# Patient Record
Sex: Male | Born: 1935 | Race: White | Hispanic: No | Marital: Married | Smoking: Former smoker
Health system: Southern US, Community
[De-identification: ages and names within clinical notes are randomized; demographics above are authoritative.]

## PROBLEM LIST (undated history)

## (undated) DIAGNOSIS — J449 Chronic obstructive pulmonary disease, unspecified: Secondary | ICD-10-CM

## (undated) DIAGNOSIS — I4891 Unspecified atrial fibrillation: Secondary | ICD-10-CM

## (undated) DIAGNOSIS — L570 Actinic keratosis: Secondary | ICD-10-CM

## (undated) DIAGNOSIS — I729 Aneurysm of unspecified site: Secondary | ICD-10-CM

## (undated) DIAGNOSIS — I10 Essential (primary) hypertension: Secondary | ICD-10-CM

## (undated) DIAGNOSIS — M1990 Unspecified osteoarthritis, unspecified site: Secondary | ICD-10-CM

## (undated) DIAGNOSIS — H409 Unspecified glaucoma: Secondary | ICD-10-CM

## (undated) HISTORY — DX: Unspecified osteoarthritis, unspecified site: M19.90

## (undated) HISTORY — PX: LEG SURGERY: SHX1003

## (undated) HISTORY — DX: Essential (primary) hypertension: I10

## (undated) HISTORY — DX: Actinic keratosis: L57.0

## (undated) HISTORY — DX: Unspecified glaucoma: H40.9

## (undated) HISTORY — DX: Unspecified atrial fibrillation: I48.91

---
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
## 2005-05-11 ENCOUNTER — Ambulatory Visit: Payer: Self-pay | Admitting: Internal Medicine

## 2007-02-21 ENCOUNTER — Ambulatory Visit: Payer: Self-pay | Admitting: Ophthalmology

## 2007-02-21 ENCOUNTER — Other Ambulatory Visit: Payer: Self-pay

## 2007-02-28 ENCOUNTER — Ambulatory Visit: Payer: Self-pay | Admitting: Ophthalmology

## 2008-07-13 HISTORY — PX: COLONOSCOPY: SHX174

## 2009-05-01 ENCOUNTER — Ambulatory Visit: Payer: Self-pay | Admitting: Family Medicine

## 2009-07-13 HISTORY — PX: SPINAL FUSION: SHX223

## 2009-12-03 ENCOUNTER — Ambulatory Visit (HOSPITAL_COMMUNITY): Admission: RE | Admit: 2009-12-03 | Discharge: 2009-12-04 | Payer: Self-pay | Admitting: Neurosurgery

## 2009-12-05 ENCOUNTER — Emergency Department: Payer: Self-pay | Admitting: Emergency Medicine

## 2010-03-13 ENCOUNTER — Ambulatory Visit: Payer: Self-pay | Admitting: Unknown Physician Specialty

## 2010-04-29 ENCOUNTER — Inpatient Hospital Stay (HOSPITAL_COMMUNITY): Admission: RE | Admit: 2010-04-29 | Discharge: 2010-05-01 | Payer: Self-pay | Admitting: Neurosurgery

## 2010-05-29 ENCOUNTER — Encounter: Admission: RE | Admit: 2010-05-29 | Discharge: 2010-05-29 | Payer: Self-pay | Admitting: Neurosurgery

## 2010-07-13 HISTORY — PX: CATARACT EXTRACTION: SUR2

## 2010-08-13 ENCOUNTER — Other Ambulatory Visit: Payer: Self-pay | Admitting: Neurosurgery

## 2010-08-13 ENCOUNTER — Ambulatory Visit
Admission: RE | Admit: 2010-08-13 | Discharge: 2010-08-13 | Disposition: A | Discharge status: Home / Self Care | Payer: Medicare Other | Source: Ambulatory Visit | Attending: Neurosurgery | Admitting: Neurosurgery

## 2010-08-13 DIAGNOSIS — Z981 Arthrodesis status: Secondary | ICD-10-CM

## 2010-09-25 LAB — DIFFERENTIAL
Basophils Absolute: 0 10*3/uL (ref 0.0–0.1)
Basophils Relative: 1 % (ref 0–1)
Eosinophils Absolute: 0.1 10*3/uL (ref 0.0–0.7)
Eosinophils Relative: 1 % (ref 0–5)
Lymphocytes Relative: 21 % (ref 12–46)
Lymphs Abs: 1.1 10*3/uL (ref 0.7–4.0)
Monocytes Absolute: 0.6 10*3/uL (ref 0.1–1.0)
Monocytes Relative: 12 % (ref 3–12)
Neutro Abs: 3.4 10*3/uL (ref 1.7–7.7)
Neutrophils Relative %: 66 % (ref 43–77)

## 2010-09-25 LAB — BASIC METABOLIC PANEL
BUN: 15 mg/dL (ref 6–23)
CO2: 28 mEq/L (ref 19–32)
Calcium: 9.9 mg/dL (ref 8.4–10.5)
Chloride: 108 mEq/L (ref 96–112)
Creatinine, Ser: 0.94 mg/dL (ref 0.4–1.5)
GFR calc Af Amer: >60 mL/min (ref >60)
GFR calc non Af Amer: >60 mL/min (ref >60)
Glucose, Bld: 96 mg/dL (ref 70–99)
Potassium: 4.8 mEq/L (ref 3.5–5.1)
Sodium: 143 mEq/L (ref 135–145)

## 2010-09-25 LAB — SURGICAL PCR SCREEN
MRSA, PCR: NEGATIVE
Staphylococcus aureus: NEGATIVE

## 2010-09-25 LAB — CBC
HCT: 46.5 % (ref 39.0–52.0)
Hemoglobin: 15.5 g/dL (ref 13.0–17.0)
MCH: 29.2 pg (ref 26.0–34.0)
MCHC: 33.3 g/dL (ref 30.0–36.0)
MCV: 87.7 fL (ref 78.0–100.0)
Platelets: 161 10*3/uL (ref 150–400)
RBC: 5.3 MIL/uL (ref 4.22–5.81)
RDW: 14.4 % (ref 11.5–15.5)
WBC: 5.2 10*3/uL (ref 4.0–10.5)

## 2010-09-25 LAB — TYPE AND SCREEN
ABO/RH(D): O POS
Antibody Screen: NEGATIVE

## 2010-09-29 LAB — BASIC METABOLIC PANEL
BUN: 18 mg/dL (ref 6–23)
CO2: 29 mEq/L (ref 19–32)
Calcium: 9.6 mg/dL (ref 8.4–10.5)
Chloride: 105 mEq/L (ref 96–112)
Creatinine, Ser: 0.92 mg/dL (ref 0.4–1.5)
GFR calc Af Amer: >60 mL/min (ref >60)
GFR calc non Af Amer: >60 mL/min (ref >60)
Glucose, Bld: 93 mg/dL (ref 70–99)
Potassium: 4.8 mEq/L (ref 3.5–5.1)
Sodium: 139 mEq/L (ref 135–145)

## 2010-09-29 LAB — DIFFERENTIAL
Basophils Absolute: 0 10*3/uL (ref 0.0–0.1)
Basophils Relative: 0 % (ref 0–1)
Eosinophils Absolute: 0 10*3/uL (ref 0.0–0.7)
Eosinophils Relative: 1 % (ref 0–5)
Lymphocytes Relative: 13 % (ref 12–46)
Lymphs Abs: 0.8 10*3/uL (ref 0.7–4.0)
Monocytes Absolute: 0.9 10*3/uL (ref 0.1–1.0)
Monocytes Relative: 14 % — ABNORMAL HIGH (ref 3–12)
Neutro Abs: 4.6 10*3/uL (ref 1.7–7.7)
Neutrophils Relative %: 72 % (ref 43–77)

## 2010-09-29 LAB — CBC
HCT: 44.8 % (ref 39.0–52.0)
Hemoglobin: 15.1 g/dL (ref 13.0–17.0)
MCHC: 33.8 g/dL (ref 30.0–36.0)
MCV: 89.6 fL (ref 78.0–100.0)
Platelets: 158 10*3/uL (ref 150–400)
RBC: 5.01 MIL/uL (ref 4.22–5.81)
RDW: 14.7 % (ref 11.5–15.5)
WBC: 6.4 10*3/uL (ref 4.0–10.5)

## 2010-09-29 LAB — SURGICAL PCR SCREEN
MRSA, PCR: NEGATIVE
Staphylococcus aureus: POSITIVE — AB

## 2010-09-29 LAB — TYPE AND SCREEN
ABO/RH(D): O POS
Antibody Screen: NEGATIVE

## 2010-09-29 LAB — ABO/RH: ABO/RH(D): O POS

## 2011-01-21 ENCOUNTER — Ambulatory Visit: Payer: Self-pay | Admitting: Ophthalmology

## 2011-03-11 ENCOUNTER — Ambulatory Visit: Payer: Self-pay | Admitting: Internal Medicine

## 2011-05-15 IMAGING — CR DG LUMBAR SPINE 2-3V
2 series · 2 of 2 positions shown · non-contrast
Comparison: 05/29/2010.

CLINICAL DATA: Post laminectomy.  No complaints.

LUMBAR SPINE - 2-3 VIEW

[t l-spine a.p.]
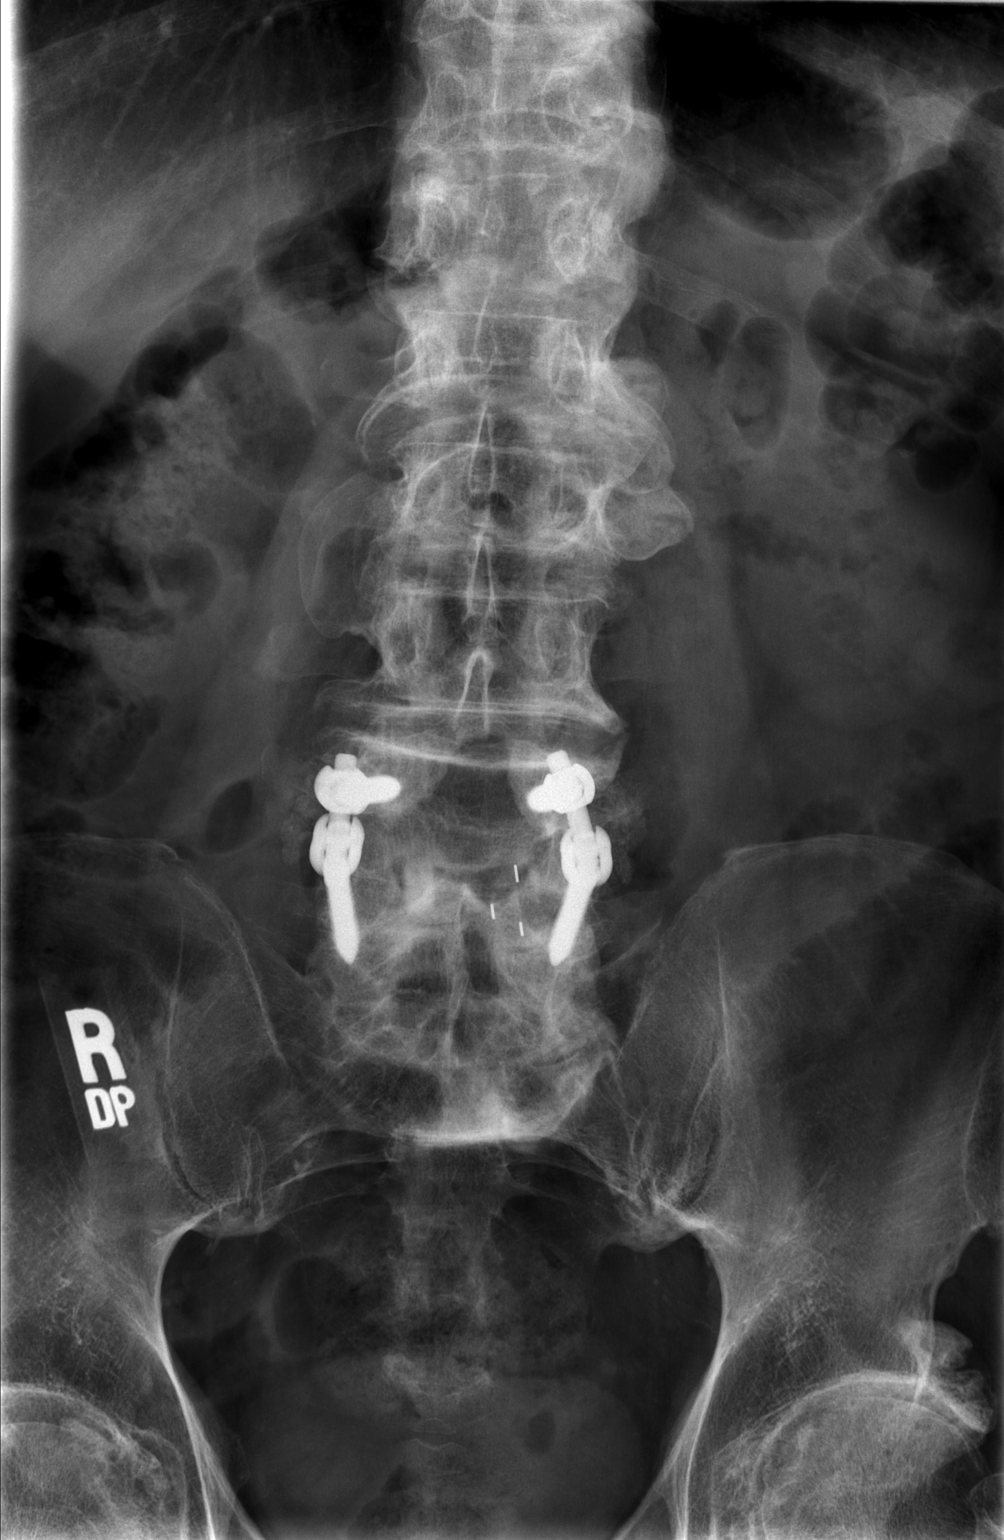

[t l-spine lat]
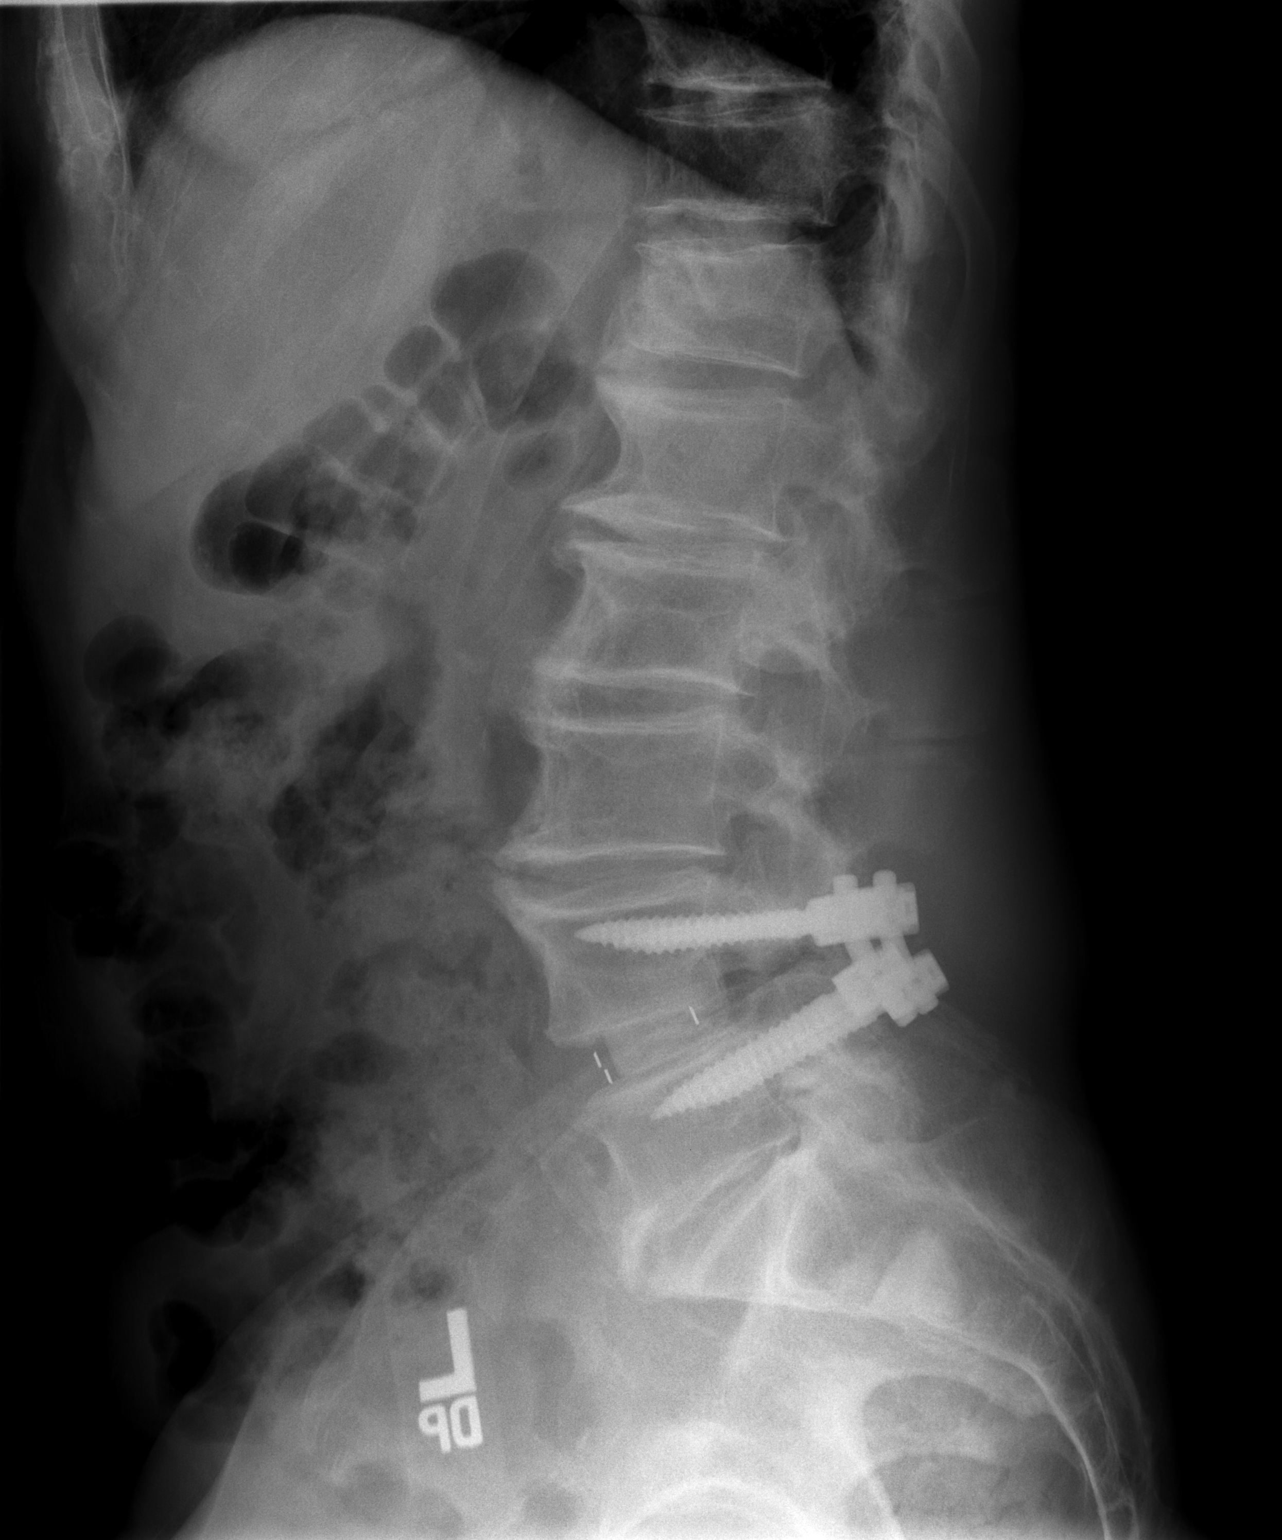

[2 of 2 positions shown; findings below may reference images not displayed]

FINDINGS: Fusion L4-5 with interbody spacer and pedicle screws.
Bony incorporation across disc space cannot be confirmed on plain
film exam.

Degenerative changes lower thoracic and remainder of lumbar spine
unchanged.

Sclerotic appearance of the S2 vertebra unchanged.  Etiology
indeterminate.  If the patient had a history of prostate cancer,
metastatic disease would then need to be considered.

Bilateral hip joint degenerative changes greater on the left.
IMPRESSION: Fusion L4-5 with interbody spacer and pedicle screws.  Bony
incorporation across disc space cannot be confirmed on plain film
exam.

Degenerative changes lower thoracic and remainder of lumbar spine
unchanged.

Sclerotic appearance of the S2 vertebra unchanged.  Etiology
indeterminate.  If the patient had a history of prostate cancer,
metastatic disease would then need to be considered.

Bilateral hip joint degenerative changes greater on the left.

## 2011-06-09 ENCOUNTER — Ambulatory Visit: Payer: Self-pay | Admitting: Gastroenterology

## 2011-07-14 DIAGNOSIS — I4891 Unspecified atrial fibrillation: Secondary | ICD-10-CM

## 2011-07-14 HISTORY — DX: Unspecified atrial fibrillation: I48.91

## 2012-08-20 ENCOUNTER — Encounter: Payer: Self-pay | Admitting: *Deleted

## 2012-08-20 DIAGNOSIS — I10 Essential (primary) hypertension: Secondary | ICD-10-CM | POA: Insufficient documentation

## 2013-04-06 ENCOUNTER — Ambulatory Visit: Payer: Self-pay | Admitting: Vascular Surgery

## 2013-09-12 ENCOUNTER — Encounter: Payer: Self-pay | Admitting: General Surgery

## 2013-09-12 ENCOUNTER — Ambulatory Visit (INDEPENDENT_AMBULATORY_CARE_PROVIDER_SITE_OTHER): Payer: Medicare Other | Admitting: General Surgery

## 2013-09-12 VITALS — BP 144/62 | HR 76 | Resp 16 | Ht 71.0 in | Wt 185.0 lb

## 2013-09-12 DIAGNOSIS — I6529 Occlusion and stenosis of unspecified carotid artery: Secondary | ICD-10-CM

## 2013-09-12 NOTE — Progress Notes (Signed)
This is a 79 year old male here today for an carotid ultrasound. No new problems at this time.  Duplex study completed showing no changes in the bilateral mild plaquing

## 2013-09-12 NOTE — Patient Instructions (Signed)
Patient to return in 1 year for follow up visit. Patient to call with any questions or concerns.

## 2013-09-14 ENCOUNTER — Encounter: Payer: Self-pay | Admitting: General Surgery

## 2013-09-22 ENCOUNTER — Other Ambulatory Visit: Payer: Medicare Other

## 2013-09-22 ENCOUNTER — Other Ambulatory Visit: Payer: Self-pay | Admitting: *Deleted

## 2013-09-22 DIAGNOSIS — I6529 Occlusion and stenosis of unspecified carotid artery: Secondary | ICD-10-CM

## 2014-08-13 DIAGNOSIS — I714 Abdominal aortic aneurysm, without rupture, unspecified: Secondary | ICD-10-CM | POA: Insufficient documentation

## 2014-08-13 DIAGNOSIS — I6523 Occlusion and stenosis of bilateral carotid arteries: Secondary | ICD-10-CM | POA: Insufficient documentation

## 2014-09-19 ENCOUNTER — Encounter: Payer: Self-pay | Admitting: General Surgery

## 2014-09-19 ENCOUNTER — Ambulatory Visit: Payer: Medicare Other

## 2014-09-19 ENCOUNTER — Ambulatory Visit (INDEPENDENT_AMBULATORY_CARE_PROVIDER_SITE_OTHER): Payer: Medicare Other | Admitting: General Surgery

## 2014-09-19 VITALS — BP 150/80 | HR 78 | Resp 14 | Ht 71.0 in | Wt 187.0 lb

## 2014-09-19 DIAGNOSIS — I6529 Occlusion and stenosis of unspecified carotid artery: Secondary | ICD-10-CM

## 2014-09-19 NOTE — Progress Notes (Signed)
Here today for carotid ultrasound. Denies any problems. Mild right ankle swelling he describes as chronic. Denies any dizziness or light headedness.   Duplex study completed. Jeremy Higgins is no change in the focal bilateral plquing causing less than 25% stenosis Follow up in one year.

## 2014-09-19 NOTE — Patient Instructions (Addendum)
The patient is aware to call back for any questions or concerns. Follow up in one year. 

## 2015-05-28 ENCOUNTER — Other Ambulatory Visit: Payer: Self-pay | Admitting: Vascular Surgery

## 2015-05-28 DIAGNOSIS — I714 Abdominal aortic aneurysm, without rupture, unspecified: Secondary | ICD-10-CM

## 2015-06-03 ENCOUNTER — Ambulatory Visit
Admission: RE | Admit: 2015-06-03 | Discharge: 2015-06-03 | Disposition: A | Discharge status: Home / Self Care | Payer: Medicare Other | Source: Ambulatory Visit | Attending: Vascular Surgery | Admitting: Vascular Surgery

## 2015-06-03 DIAGNOSIS — K5730 Diverticulosis of large intestine without perforation or abscess without bleeding: Secondary | ICD-10-CM | POA: Diagnosis not present

## 2015-06-03 DIAGNOSIS — I714 Abdominal aortic aneurysm, without rupture, unspecified: Secondary | ICD-10-CM

## 2015-06-03 LAB — POCT I-STAT CREATININE: CREATININE: 1.1 mg/dL (ref 0.61–1.24)

## 2015-06-03 MED ORDER — IOHEXOL 350 MG/ML SOLN
125.0000 mL | Freq: Once | INTRAVENOUS | Status: AC | PRN
  Administered 2015-06-03: 100 mL via INTRAVENOUS

## 2015-06-03 MED ORDER — IOHEXOL 350 MG/ML SOLN: 100.0000 mL | Freq: Once | INTRAVENOUS | Status: DC | PRN

## 2015-06-11 ENCOUNTER — Other Ambulatory Visit: Payer: Self-pay | Admitting: Vascular Surgery

## 2015-06-18 ENCOUNTER — Encounter: Payer: Self-pay | Admitting: *Deleted

## 2015-06-18 ENCOUNTER — Encounter
Admission: RE | Admit: 2015-06-18 | Discharge: 2015-06-18 | Disposition: A | Discharge status: Home / Self Care | Payer: Medicare Other | Source: Ambulatory Visit | Attending: Vascular Surgery | Admitting: Vascular Surgery

## 2015-06-18 ENCOUNTER — Ambulatory Visit: Payer: Medicare Other

## 2015-06-18 ENCOUNTER — Ambulatory Visit
Admission: RE | Admit: 2015-06-18 | Discharge: 2015-06-18 | Disposition: A | Discharge status: Home / Self Care | Payer: Medicare Other | Source: Ambulatory Visit | Attending: Vascular Surgery | Admitting: Vascular Surgery

## 2015-06-18 ENCOUNTER — Encounter
Admission: RE | Disposition: A | Discharge status: Home / Self Care | Payer: Self-pay | Source: Ambulatory Visit | Attending: Vascular Surgery

## 2015-06-18 DIAGNOSIS — J449 Chronic obstructive pulmonary disease, unspecified: Secondary | ICD-10-CM | POA: Diagnosis not present

## 2015-06-18 DIAGNOSIS — I723 Aneurysm of iliac artery: Secondary | ICD-10-CM | POA: Insufficient documentation

## 2015-06-18 DIAGNOSIS — Z7901 Long term (current) use of anticoagulants: Secondary | ICD-10-CM | POA: Diagnosis not present

## 2015-06-18 DIAGNOSIS — K210 Gastro-esophageal reflux disease with esophagitis: Secondary | ICD-10-CM | POA: Diagnosis not present

## 2015-06-18 DIAGNOSIS — H409 Unspecified glaucoma: Secondary | ICD-10-CM | POA: Insufficient documentation

## 2015-06-18 DIAGNOSIS — E785 Hyperlipidemia, unspecified: Secondary | ICD-10-CM | POA: Insufficient documentation

## 2015-06-18 DIAGNOSIS — G14 Postpolio syndrome: Secondary | ICD-10-CM | POA: Insufficient documentation

## 2015-06-18 DIAGNOSIS — I2510 Atherosclerotic heart disease of native coronary artery without angina pectoris: Secondary | ICD-10-CM | POA: Insufficient documentation

## 2015-06-18 DIAGNOSIS — R739 Hyperglycemia, unspecified: Secondary | ICD-10-CM | POA: Insufficient documentation

## 2015-06-18 DIAGNOSIS — M109 Gout, unspecified: Secondary | ICD-10-CM | POA: Diagnosis not present

## 2015-06-18 DIAGNOSIS — Z87891 Personal history of nicotine dependence: Secondary | ICD-10-CM | POA: Diagnosis not present

## 2015-06-18 DIAGNOSIS — I10 Essential (primary) hypertension: Secondary | ICD-10-CM | POA: Insufficient documentation

## 2015-06-18 DIAGNOSIS — I714 Abdominal aortic aneurysm, without rupture: Secondary | ICD-10-CM | POA: Diagnosis present

## 2015-06-18 DIAGNOSIS — Z79899 Other long term (current) drug therapy: Secondary | ICD-10-CM | POA: Diagnosis not present

## 2015-06-18 DIAGNOSIS — Z01818 Encounter for other preprocedural examination: Secondary | ICD-10-CM

## 2015-06-18 HISTORY — PX: PERIPHERAL VASCULAR CATHETERIZATION: SHX172C

## 2015-06-18 HISTORY — DX: Aneurysm of unspecified site: I72.9

## 2015-06-18 HISTORY — DX: Chronic obstructive pulmonary disease, unspecified: J44.9

## 2015-06-18 LAB — CBC WITH DIFFERENTIAL/PLATELET
BASOS ABS: 0 10*3/uL (ref 0–0.1)
BASOS PCT: 0 %
EOS ABS: 0.1 10*3/uL (ref 0–0.7)
EOS PCT: 1 %
HCT: 47.6 % (ref 40.0–52.0)
HEMOGLOBIN: 15.4 g/dL (ref 13.0–18.0)
Lymphocytes Relative: 9 %
Lymphs Abs: 0.8 10*3/uL — ABNORMAL LOW (ref 1.0–3.6)
MCH: 28.2 pg (ref 26.0–34.0)
MCHC: 32.4 g/dL (ref 32.0–36.0)
MCV: 87 fL (ref 80.0–100.0)
Monocytes Absolute: 1.3 10*3/uL — ABNORMAL HIGH (ref 0.2–1.0)
Monocytes Relative: 15 %
NEUTROS PCT: 75 %
Neutro Abs: 6.7 10*3/uL — ABNORMAL HIGH (ref 1.4–6.5)
PLATELETS: 147 10*3/uL — AB (ref 150–440)
RBC: 5.47 MIL/uL (ref 4.40–5.90)
RDW: 14.6 % — ABNORMAL HIGH (ref 11.5–14.5)
WBC: 9 10*3/uL (ref 3.8–10.6)

## 2015-06-18 LAB — BASIC METABOLIC PANEL
Anion gap: 7 (ref 5–15)
BUN: 17 mg/dL (ref 6–20)
CALCIUM: 9.2 mg/dL (ref 8.9–10.3)
CO2: 25 mmol/L (ref 22–32)
CREATININE: 1 mg/dL (ref 0.61–1.24)
Chloride: 108 mmol/L (ref 101–111)
Glucose, Bld: 100 mg/dL — ABNORMAL HIGH (ref 65–99)
Potassium: 4.2 mmol/L (ref 3.5–5.1)
SODIUM: 140 mmol/L (ref 135–145)

## 2015-06-18 LAB — PROTIME-INR
INR: 1.08
PROTHROMBIN TIME: 14.2 s (ref 11.4–15.0)

## 2015-06-18 LAB — SURGICAL PCR SCREEN
MRSA, PCR: NEGATIVE
STAPHYLOCOCCUS AUREUS: POSITIVE — AB

## 2015-06-18 LAB — APTT: APTT: 32 s (ref 24–36)

## 2015-06-18 SURGERY — ABDOMINAL AORTOGRAM W/LOWER EXTREMITY
Laterality: Right | Wound class: Clean

## 2015-06-18 MED ORDER — FENTANYL CITRATE (PF) 100 MCG/2ML IJ SOLN
INTRAMUSCULAR | Status: DC | PRN
  Administered 2015-06-18: 50 ug via INTRAVENOUS

## 2015-06-18 MED ORDER — OXYCODONE HCL 5 MG PO TABS: 5.0000 mg | ORAL_TABLET | ORAL | Status: DC | PRN

## 2015-06-18 MED ORDER — DOCUSATE SODIUM 100 MG PO CAPS
100.0000 mg | ORAL_CAPSULE | Freq: Every day | ORAL | Status: DC
  Filled 2015-06-18 (×2): qty 1

## 2015-06-18 MED ORDER — MIDAZOLAM HCL 2 MG/2ML IJ SOLN
INTRAMUSCULAR | Status: DC | PRN
  Administered 2015-06-18: 2 mg via INTRAVENOUS

## 2015-06-18 MED ORDER — METOPROLOL TARTRATE 1 MG/ML IV SOLN: 2.0000 mg | INTRAVENOUS | Status: DC | PRN

## 2015-06-18 MED ORDER — ALUM & MAG HYDROXIDE-SIMETH 200-200-20 MG/5ML PO SUSP: 15.0000 mL | ORAL | Status: DC | PRN

## 2015-06-18 MED ORDER — DEXTROSE 5 % IV SOLN
1.5000 g | INTRAVENOUS | Status: AC
  Administered 2015-06-18: 1.5 g via INTRAVENOUS
  Filled 2015-06-18: qty 1.5

## 2015-06-18 MED ORDER — FENTANYL CITRATE (PF) 100 MCG/2ML IJ SOLN
INTRAMUSCULAR | Status: AC
  Filled 2015-06-18: qty 2

## 2015-06-18 MED ORDER — IOHEXOL 300 MG/ML  SOLN
INTRAMUSCULAR | Status: DC | PRN
  Administered 2015-06-18: 35 mL via INTRA_ARTERIAL

## 2015-06-18 MED ORDER — ONDANSETRON HCL 4 MG/2ML IJ SOLN: 4.0000 mg | Freq: Four times a day (QID) | INTRAMUSCULAR | Status: DC | PRN

## 2015-06-18 MED ORDER — PHENOL 1.4 % MT LIQD
1.0000 | OROMUCOSAL | Status: DC | PRN
  Filled 2015-06-18: qty 177

## 2015-06-18 MED ORDER — HEPARIN SODIUM (PORCINE) 1000 UNIT/ML IJ SOLN
INTRAMUSCULAR | Status: AC
  Filled 2015-06-18: qty 1

## 2015-06-18 MED ORDER — LABETALOL HCL 5 MG/ML IV SOLN: 10.0000 mg | INTRAVENOUS | Status: DC | PRN

## 2015-06-18 MED ORDER — MIDAZOLAM HCL 2 MG/2ML IJ SOLN
INTRAMUSCULAR | Status: AC
  Filled 2015-06-18: qty 2

## 2015-06-18 MED ORDER — SODIUM CHLORIDE 0.9 % IV SOLN: 500.0000 mL | Freq: Once | INTRAVENOUS | Status: DC | PRN

## 2015-06-18 MED ORDER — PANTOPRAZOLE SODIUM 40 MG PO TBEC
40.0000 mg | DELAYED_RELEASE_TABLET | Freq: Every day | ORAL | Status: DC
  Filled 2015-06-18 (×2): qty 1

## 2015-06-18 MED ORDER — ACETAMINOPHEN 325 MG RE SUPP
325.0000 mg | RECTAL | Status: DC | PRN
  Filled 2015-06-18: qty 2

## 2015-06-18 MED ORDER — HYDRALAZINE HCL 20 MG/ML IJ SOLN: 5.0000 mg | INTRAMUSCULAR | Status: DC | PRN

## 2015-06-18 MED ORDER — LIDOCAINE HCL (PF) 1 % IJ SOLN
INTRAMUSCULAR | Status: AC
  Filled 2015-06-18: qty 10

## 2015-06-18 MED ORDER — GUAIFENESIN-DM 100-10 MG/5ML PO SYRP
15.0000 mL | ORAL_SOLUTION | ORAL | Status: DC | PRN
  Filled 2015-06-18: qty 15

## 2015-06-18 MED ORDER — HEPARIN (PORCINE) IN NACL 2-0.9 UNIT/ML-% IJ SOLN
INTRAMUSCULAR | Status: AC
  Filled 2015-06-18: qty 1000

## 2015-06-18 MED ORDER — SODIUM CHLORIDE 0.9 % IV SOLN
INTRAVENOUS | Status: DC
  Administered 2015-06-18 (×2): via INTRAVENOUS

## 2015-06-18 MED ORDER — ACETAMINOPHEN 325 MG PO TABS: 325.0000 mg | ORAL_TABLET | ORAL | Status: DC | PRN

## 2015-06-18 SURGICAL SUPPLY — 14 items
CATH PIG 70CM (CATHETERS) ×5 IMPLANT
CATH RIM 65CM (CATHETERS) ×5 IMPLANT
COIL MREYE 5X8 (Vascular Products) ×5 IMPLANT
COIL NESTER 14X8 (Vascular Products) ×5 IMPLANT
DEVICE CLOSURE MYNXGRIP 5F (Vascular Products) ×5 IMPLANT
GLIDECATH NONTAPER ANGL 5FR (CATHETERS) ×5 IMPLANT
GLIDEWIRE STIFF .35X180X3 HYDR (WIRE) ×5 IMPLANT
PACK ANGIOGRAPHY (CUSTOM PROCEDURE TRAY) ×5 IMPLANT
SET INTRO CAPELLA COAXIAL (SET/KITS/TRAYS/PACK) ×5 IMPLANT
SHEATH BALKIN 5.5FR (SHEATH) ×5 IMPLANT
SHEATH BRITE TIP 5FRX11 (SHEATH) ×10 IMPLANT
SYR MEDRAD MARK V 150ML (SYRINGE) ×5 IMPLANT
TUBING CONTRAST HIGH PRESS 72 (TUBING) ×5 IMPLANT
WIRE J 3MM .035X145CM (WIRE) ×5 IMPLANT

## 2015-06-18 NOTE — Patient Instructions (Signed)
  Your procedure is scheduled on: 06/26/15 Wed Report to Day Surgery. To find out your arrival time please call [phone] between 1PM - 3PM on 06/25/15 Tues.  Remember: Instructions that are not followed completely may result in serious medical risk, up to and including death, or upon the discretion of your surgeon and anesthesiologist your surgery may need to be rescheduled.    _x___ 1. Do not eat food or drink liquids after midnight. No gum chewing or hard candies.     ____ 2. No Alcohol for 24 hours before or after surgery.   ____ 3. Bring all medications with you on the day of surgery if instructed.    __x__ 4. Notify your doctor if there is any change in your medical condition     (cold, fever, infections).     Do not wear jewelry, make-up, hairpins, clips or nail polish.  Do not wear lotions, powders, or perfumes. You may wear deodorant.  Do not shave 48 hours prior to surgery. Men may shave face and neck.  Do not bring valuables to the hospital.    [facility] is not responsible for any belongings or valuables.               Contacts, dentures or bridgework may not be worn into surgery.  Leave your suitcase in the car. After surgery it may be brought to your room.  For patients admitted to the hospital, discharge time is determined by your                treatment team.   Patients discharged the day of surgery will not be allowed to drive home.   Please read over the following fact sheets that you were given:   MRSA Information   ___x_ Take these medicines the morning of surgery with A SIP OF WATER:    1. diltiazem (DILTIAZEM CD) 180 MG 24 hr capsule  2. lisinopril (PRINIVIL,ZESTRIL) 10 MG tablet  3.   4.  5.  6.  ____ Fleet Enema (as directed)   _x___ Use CHG Soap as directed  ____ Use inhalers on the day of surgery  ____ Stop metformin 2 days prior to surgery    ____ Take 1/2 of usual insulin dose the night before surgery and none on the morning of  surgery.   __x__ Stop Coumadin/Plavix/aspirin on stopped ELIQUIS 5 MG TABS tablet 3 days ago for procedure today and stop 06/23/15, 3 days prior to surgery.   _x___ Stop Anti-inflammatories Stopped Aleve 3 days ago   ____ Stop supplements until after surgery.    ____ Bring C-Pap to the hospital.

## 2015-06-18 NOTE — H&P (Signed)
[facility] History & Physical Update  The patient was interviewed and re-examined.  The patient's previous History and Physical has been reviewed and is unchanged.  There is no change in the plan of care. We plan to proceed with the scheduled procedure.  Wallace Gappa, Dolores Lory, MD  06/18/2015, 3:00 PM

## 2015-06-18 NOTE — Pre-Procedure Instructions (Signed)
Dr Nino Parsley office called regarding positive staph culture.  No orders given.

## 2015-06-18 NOTE — Op Note (Signed)
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  Right selective pelvic arteriography second order catheter placement  2.  Coil embolization of the right internal iliac artery in preparation for endograft repair of his abdominal aortic aneurysm  3.  Minx closure left common femoral   Anesthesia: Conscious sedation with IV Versed plus fentanyl  Sheath: 5 [place] Balkan sheath left common femoral  Contrast: 35 cc  Fluoroscopy Time: 4.7 minutes  Indications:  Patient has a large abdominal aortic aneurysm associated with a very large common iliac artery aneurysm on the right. In order to repair this with an endograft will require coil embolization of the right internal to prevent a type II endoleak. The rest benefits of been reviewed with the patient all questions been answered he agrees to proceed with coil embolization.  Procedure:  Jeremy Adler Jonesis a 80 y.o. male who was identified and appropriate procedural time out was performed.  The patient was then placed supine on the table and prepped and draped in the usual sterile fashion.  Ultrasound was used to evaluate the left common femoral artery.  It was patent .  A digital ultrasound image was acquired.  A Seldinger needle was used to access the left common femoral artery under direct ultrasound guidance and a permanent image was performed.  A 0.035 J wire was advanced without resistance and a 5Fr sheath was placed.  A pigtail catheter was advanced into the distal aorta and LAO projection of the distal aorta and iliac arteries was then obtained. Rim catheter and stiff angle Glidewire were then used to cross the bifurcation and subsequently a 5 [place] Balkan sheath was inserted. Using the  guidewire in association with a Kumpe catheter the internal iliac artery was engaged and the wire catheter combination negotiated down into the internal iliac past the first internal iliac artery division area  Hand injection contrast was then used to demonstrate the internal iliac and the 2 branches subsequently the catheter was repositioned slightly and an 8 x 6 nester coil and then an 8 x 14 nester coil was placed within the internal iliac. Follow-up imaging by hand through the Kumpe catheter now in the common iliac artery aneurysm demonstrated the coils were placed so that there is now a flush occlusion of the internal iliac. There is already cessation of flow.  The catheter were was removed and the sheath repositioned into the left external iliac oblique view obtained and a minx device deployed without difficulty.  Findings:   Aortogram:  Aneurysmal changes of the distal aorta as well as the common iliac arteries are noted the right common iliac is very large actually larger than the aortic aneurysm. The catheter is then negotiated into the internal which is imaged by hand demonstrating a short trunk from the origin measuring approximately 1 cm and then a branch medial the main internal continues and then divides again approximately 2 cm below this.  Initially, the 8 x 6 coil was placed and then an 8 x 14 which ends up on final imaging being a flush occlusion of the internal iliac.  Summary: Successful coil embolization of the right internal iliac in preparation for endograft placement     Disposition: Patient was taken to the recovery room in stable condition having tolerated the procedure well.  Saben Donigan,  Dolores Lory 06/18/2015,3:34 PM

## 2015-06-19 ENCOUNTER — Encounter: Payer: Self-pay | Admitting: Vascular Surgery

## 2015-06-19 LAB — ABO/RH: ABO/RH(D): O POS

## 2015-06-19 NOTE — Pre-Procedure Instructions (Signed)
Cleared by Dr Kowalski 

## 2015-06-19 NOTE — Pre-Procedure Instructions (Signed)
EKG sent to [facility] from anesthesia for review.  Copy faxed to Dr. Delana Meyer also.

## 2015-06-26 ENCOUNTER — Ambulatory Visit: Payer: Medicare Other | Admitting: Anesthesiology

## 2015-06-26 ENCOUNTER — Inpatient Hospital Stay
Admission: RE | Admit: 2015-06-26 | Discharge: 2015-06-27 | DRG: 269 | Disposition: A | Discharge status: Home / Self Care | Payer: Medicare Other | Source: Ambulatory Visit | Attending: Vascular Surgery | Admitting: Vascular Surgery

## 2015-06-26 ENCOUNTER — Encounter
Admission: RE | Disposition: A | Discharge status: Home / Self Care | Payer: Self-pay | Source: Ambulatory Visit | Attending: Vascular Surgery

## 2015-06-26 DIAGNOSIS — I714 Abdominal aortic aneurysm, without rupture, unspecified: Secondary | ICD-10-CM | POA: Diagnosis present

## 2015-06-26 DIAGNOSIS — I723 Aneurysm of iliac artery: Secondary | ICD-10-CM | POA: Diagnosis present

## 2015-06-26 HISTORY — PX: PERIPHERAL VASCULAR CATHETERIZATION: SHX172C

## 2015-06-26 LAB — BUN: BUN: 19 mg/dL (ref 6–20)

## 2015-06-26 LAB — PREPARE RBC (CROSSMATCH)

## 2015-06-26 LAB — GLUCOSE, CAPILLARY: Glucose-Capillary: 93 mg/dL (ref 65–99)

## 2015-06-26 LAB — CREATININE, SERUM: Creatinine, Ser: 1.02 mg/dL (ref 0.61–1.24)

## 2015-06-26 SURGERY — ENDOVASCULAR REPAIR/STENT GRAFT
Anesthesia: General

## 2015-06-26 MED ORDER — ALUM & MAG HYDROXIDE-SIMETH 200-200-20 MG/5ML PO SUSP: 15.0000 mL | ORAL | Status: DC | PRN

## 2015-06-26 MED ORDER — DOPAMINE-DEXTROSE 3.2-5 MG/ML-% IV SOLN
3.0000 ug/kg/min | INTRAVENOUS | Status: DC
  Filled 2015-06-26: qty 250

## 2015-06-26 MED ORDER — ACETAMINOPHEN 325 MG PO TABS: 325.0000 mg | ORAL_TABLET | ORAL | Status: DC | PRN

## 2015-06-26 MED ORDER — FENTANYL CITRATE (PF) 100 MCG/2ML IJ SOLN
INTRAMUSCULAR | Status: DC | PRN
  Administered 2015-06-26: 250 ug via INTRAVENOUS

## 2015-06-26 MED ORDER — LIDOCAINE HCL (CARDIAC) 20 MG/ML IV SOLN
INTRAVENOUS | Status: DC | PRN
  Administered 2015-06-26: 100 mg via INTRAVENOUS

## 2015-06-26 MED ORDER — FOLIC ACID 1 MG PO TABS
1.0000 mg | ORAL_TABLET | Freq: Every day | ORAL | Status: DC
  Administered 2015-06-26 – 2015-06-27 (×2): 1 mg via ORAL
  Filled 2015-06-26 (×2): qty 1

## 2015-06-26 MED ORDER — OXYCODONE-ACETAMINOPHEN 5-325 MG PO TABS
1.0000 | ORAL_TABLET | ORAL | Status: DC | PRN
  Administered 2015-06-26: 2 via ORAL
  Filled 2015-06-26: qty 2

## 2015-06-26 MED ORDER — HYDROMORPHONE HCL 1 MG/ML IJ SOLN: 0.2500 mg | INTRAMUSCULAR | Status: DC | PRN

## 2015-06-26 MED ORDER — PHENOL 1.4 % MT LIQD: 1.0000 | OROMUCOSAL | Status: DC | PRN

## 2015-06-26 MED ORDER — LISINOPRIL 10 MG PO TABS
10.0000 mg | ORAL_TABLET | Freq: Every day | ORAL | Status: DC
  Administered 2015-06-26 – 2015-06-27 (×2): 10 mg via ORAL
  Filled 2015-06-26 (×2): qty 1

## 2015-06-26 MED ORDER — MORPHINE SULFATE (PF) 2 MG/ML IV SOLN
2.0000 mg | INTRAVENOUS | Status: DC | PRN
  Administered 2015-06-26: 2 mg via INTRAVENOUS
  Filled 2015-06-26: qty 2

## 2015-06-26 MED ORDER — PHENYLEPHRINE HCL 10 MG/ML IJ SOLN
10.0000 mg | INTRAMUSCULAR | Status: DC | PRN
  Administered 2015-06-26: 30 ug/min via INTRAVENOUS

## 2015-06-26 MED ORDER — POTASSIUM CHLORIDE CRYS ER 20 MEQ PO TBCR: 20.0000 meq | EXTENDED_RELEASE_TABLET | Freq: Every day | ORAL | Status: DC | PRN

## 2015-06-26 MED ORDER — IOHEXOL 300 MG/ML  SOLN
INTRAMUSCULAR | Status: DC | PRN
  Administered 2015-06-26: 55 mL via INTRA_ARTERIAL

## 2015-06-26 MED ORDER — NITROGLYCERIN IN D5W 200-5 MCG/ML-% IV SOLN
5.0000 ug/min | INTRAVENOUS | Status: DC
  Filled 2015-06-26: qty 250

## 2015-06-26 MED ORDER — CEFAZOLIN SODIUM-DEXTROSE 2-3 GM-% IV SOLR
2.0000 g | INTRAVENOUS | Status: AC
  Administered 2015-06-26: 2 g via INTRAVENOUS
  Filled 2015-06-26: qty 50

## 2015-06-26 MED ORDER — LABETALOL HCL 5 MG/ML IV SOLN: 10.0000 mg | INTRAVENOUS | Status: DC | PRN

## 2015-06-26 MED ORDER — APIXABAN 5 MG PO TABS
5.0000 mg | ORAL_TABLET | Freq: Two times a day (BID) | ORAL | Status: DC
  Administered 2015-06-26 – 2015-06-27 (×3): 5 mg via ORAL
  Filled 2015-06-26 (×3): qty 1

## 2015-06-26 MED ORDER — SODIUM CHLORIDE 0.9 % IV SOLN
INTRAVENOUS | Status: DC
  Administered 2015-06-26: 13:00:00 via INTRAVENOUS

## 2015-06-26 MED ORDER — VITAMIN C 500 MG PO TABS
500.0000 mg | ORAL_TABLET | Freq: Every day | ORAL | Status: DC
  Administered 2015-06-26 – 2015-06-27 (×2): 500 mg via ORAL
  Filled 2015-06-26 (×2): qty 1

## 2015-06-26 MED ORDER — PHENYLEPHRINE HCL 10 MG/ML IJ SOLN
INTRAMUSCULAR | Status: DC | PRN
  Administered 2015-06-26: 200 ug via INTRAVENOUS
  Administered 2015-06-26 (×2): 100 ug via INTRAVENOUS

## 2015-06-26 MED ORDER — DILTIAZEM HCL ER COATED BEADS 180 MG PO CP24
180.0000 mg | ORAL_CAPSULE | Freq: Every day | ORAL | Status: DC
  Administered 2015-06-27: 180 mg via ORAL
  Filled 2015-06-26: qty 1

## 2015-06-26 MED ORDER — FAMOTIDINE IN NACL 20-0.9 MG/50ML-% IV SOLN
20.0000 mg | Freq: Two times a day (BID) | INTRAVENOUS | Status: DC
  Administered 2015-06-26 – 2015-06-27 (×3): 20 mg via INTRAVENOUS
  Filled 2015-06-26 (×4): qty 50

## 2015-06-26 MED ORDER — DOCUSATE SODIUM 100 MG PO CAPS
100.0000 mg | ORAL_CAPSULE | Freq: Every day | ORAL | Status: DC
  Administered 2015-06-27: 100 mg via ORAL
  Filled 2015-06-26: qty 1

## 2015-06-26 MED ORDER — HEPARIN SODIUM (PORCINE) 1000 UNIT/ML IJ SOLN
INTRAMUSCULAR | Status: DC | PRN
  Administered 2015-06-26: 5000 [IU] via INTRAVENOUS

## 2015-06-26 MED ORDER — GLYCOPYRROLATE 0.2 MG/ML IJ SOLN
INTRAMUSCULAR | Status: DC | PRN
  Administered 2015-06-26: 0.6 mg via INTRAVENOUS

## 2015-06-26 MED ORDER — FAMOTIDINE 20 MG PO TABS
20.0000 mg | ORAL_TABLET | Freq: Once | ORAL | Status: AC
  Administered 2015-06-26: 20 mg via ORAL

## 2015-06-26 MED ORDER — VANCOMYCIN HCL IN DEXTROSE 1-5 GM/200ML-% IV SOLN
1000.0000 mg | Freq: Two times a day (BID) | INTRAVENOUS | Status: AC
  Administered 2015-06-26 (×2): 1000 mg via INTRAVENOUS
  Filled 2015-06-26 (×2): qty 200

## 2015-06-26 MED ORDER — LACTATED RINGERS IV SOLN
INTRAVENOUS | Status: DC
  Administered 2015-06-26: 07:00:00 via INTRAVENOUS

## 2015-06-26 MED ORDER — ONDANSETRON HCL 4 MG/2ML IJ SOLN
4.0000 mg | Freq: Four times a day (QID) | INTRAMUSCULAR | Status: DC | PRN
  Filled 2015-06-26: qty 2

## 2015-06-26 MED ORDER — DEXTROSE 5 % IV SOLN
1.5000 g | Freq: Two times a day (BID) | INTRAVENOUS | Status: AC
  Administered 2015-06-26 (×2): 1.5 g via INTRAVENOUS
  Filled 2015-06-26 (×2): qty 1.5

## 2015-06-26 MED ORDER — LATANOPROST 0.005 % OP SOLN
1.0000 [drp] | Freq: Every day | OPHTHALMIC | Status: DC
  Administered 2015-06-26: 1 [drp] via OPHTHALMIC
  Filled 2015-06-26: qty 2.5

## 2015-06-26 MED ORDER — FAMOTIDINE 20 MG PO TABS
ORAL_TABLET | ORAL | Status: AC
  Administered 2015-06-26: 20 mg via ORAL
  Filled 2015-06-26: qty 1

## 2015-06-26 MED ORDER — SODIUM CHLORIDE 0.9 % IV SOLN: 500.0000 mL | Freq: Once | INTRAVENOUS | Status: DC | PRN

## 2015-06-26 MED ORDER — PRAVASTATIN SODIUM 20 MG PO TABS
40.0000 mg | ORAL_TABLET | Freq: Every day | ORAL | Status: DC
  Administered 2015-06-26: 40 mg via ORAL
  Filled 2015-06-26: qty 2
  Filled 2015-06-26: qty 1

## 2015-06-26 MED ORDER — ONDANSETRON HCL 4 MG/2ML IJ SOLN: 4.0000 mg | Freq: Once | INTRAMUSCULAR | Status: DC | PRN

## 2015-06-26 MED ORDER — HYDRALAZINE HCL 20 MG/ML IJ SOLN: 5.0000 mg | INTRAMUSCULAR | Status: DC | PRN

## 2015-06-26 MED ORDER — ACETAMINOPHEN 325 MG RE SUPP: 325.0000 mg | RECTAL | Status: DC | PRN

## 2015-06-26 MED ORDER — METOPROLOL TARTRATE 1 MG/ML IV SOLN
2.0000 mg | INTRAVENOUS | Status: DC | PRN
  Administered 2015-06-27: 5 mg via INTRAVENOUS
  Filled 2015-06-26: qty 5

## 2015-06-26 MED ORDER — ROCURONIUM BROMIDE 100 MG/10ML IV SOLN
INTRAVENOUS | Status: DC | PRN
  Administered 2015-06-26: 50 mg via INTRAVENOUS
  Administered 2015-06-26: 15 mg via INTRAVENOUS

## 2015-06-26 MED ORDER — MAGNESIUM SULFATE 2 GM/50ML IV SOLN: 2.0000 g | Freq: Every day | INTRAVENOUS | Status: DC | PRN

## 2015-06-26 MED ORDER — HYDROMORPHONE HCL 1 MG/ML IJ SOLN
INTRAMUSCULAR | Status: DC | PRN
  Administered 2015-06-26: 0.5 mg via INTRAVENOUS

## 2015-06-26 MED ORDER — GUAIFENESIN-DM 100-10 MG/5ML PO SYRP: 15.0000 mL | ORAL_SOLUTION | ORAL | Status: DC | PRN

## 2015-06-26 MED ORDER — NEOSTIGMINE METHYLSULFATE 10 MG/10ML IV SOLN
INTRAVENOUS | Status: DC | PRN
  Administered 2015-06-26: 4 mg via INTRAVENOUS

## 2015-06-26 MED ORDER — PROPOFOL 10 MG/ML IV BOLUS
INTRAVENOUS | Status: DC | PRN
  Administered 2015-06-26: 150 mg via INTRAVENOUS

## 2015-06-26 SURGICAL SUPPLY — 41 items
BALLN ULTRVRSE 12X40X75 (BALLOONS) ×6
BALLOON ULTRVRSE 12X40X75 (BALLOONS) ×2 IMPLANT
BRUSH SCRUB 4% CHG (MISCELLANEOUS) ×3 IMPLANT
CATH ACCU-VU SIZ PIG 5F 70CM (CATHETERS) ×3 IMPLANT
CATH BALLN CODA 9X100X32 (BALLOONS) ×3 IMPLANT
CATH KA2 5FR 65CM (CATHETERS) ×3 IMPLANT
DEVICE PRESTO INFLATION (MISCELLANEOUS) ×3 IMPLANT
DEVICE TORQUE (MISCELLANEOUS) ×3 IMPLANT
DRYSEAL FLEXSHEATH 12FR 33CM (SHEATH) ×2
DRYSEAL FLEXSHEATH 16FR 33CM (SHEATH) ×2
EXCLUDER TNK LEG 26MX12X18 (Endovascular Graft) ×1 IMPLANT
EXCLUDER TRUNK LEG 26MX12X18 (Endovascular Graft) ×3 IMPLANT
GAUZE SPONGE 4X4 12PLY STRL (GAUZE/BANDAGES/DRESSINGS) ×6 IMPLANT
GLIDEWIRE STIFF .35X180X3 HYDR (WIRE) ×3 IMPLANT
GLOVE BIO SURGEON STRL SZ7 (GLOVE) ×18 IMPLANT
GLOVE SURG SYN 8.0 (GLOVE) ×3 IMPLANT
GOWN STRL REUS W/ TWL LRG LVL3 (GOWN DISPOSABLE) ×2 IMPLANT
GOWN STRL REUS W/ TWL XL LVL3 (GOWN DISPOSABLE) ×2 IMPLANT
GOWN STRL REUS W/TWL LRG LVL3 (GOWN DISPOSABLE) ×4
GOWN STRL REUS W/TWL XL LVL3 (GOWN DISPOSABLE) ×4
GRAFT EXCLUDER LEG 14.5X14 (Endovascular Graft) ×3 IMPLANT
GRAFT EXCLUDER LEG 16X10 (Endovascular Graft) ×3 IMPLANT
LIQUID BAND (GAUZE/BANDAGES/DRESSINGS) ×3 IMPLANT
PACK BASIN MAJOR ARMC (MISCELLANEOUS) ×3 IMPLANT
PAD GROUND ADULT SPLIT (MISCELLANEOUS) ×3 IMPLANT
PREP CHG 10.5 TEAL (MISCELLANEOUS) ×3 IMPLANT
SET INTRO CAPELLA COAXIAL (SET/KITS/TRAYS/PACK) ×3 IMPLANT
SHEATH BRITE TIP 6FRX11 (SHEATH) ×6 IMPLANT
SHEATH BRITE TIP 8FRX11 (SHEATH) ×6 IMPLANT
SHEATH DRYSEAL FLEX 12FR 33CM (SHEATH) ×1 IMPLANT
SHEATH DRYSEAL FLEX 16FR 33CM (SHEATH) ×1 IMPLANT
SUT MNCRL 4-0 (SUTURE) ×2
SUT MNCRL 4-0 27XMFL (SUTURE) ×1
SUTURE MNCRL 4-0 27XMF (SUTURE) ×1 IMPLANT
SYR 20CC LL (SYRINGE) ×3 IMPLANT
SYR MEDRAD MARK V 150ML (SYRINGE) ×3 IMPLANT
TOWEL OR 17X26 4PK STRL BLUE (TOWEL DISPOSABLE) ×6 IMPLANT
TUBING CONTRAST HIGH PRESS 72 (TUBING) ×3 IMPLANT
WIRE AMPLATZ SSTIFF .035X260CM (WIRE) ×6 IMPLANT
WIRE J 3MM .035X145CM (WIRE) ×6 IMPLANT
WIRE NITINOL .018 (WIRE) ×3 IMPLANT

## 2015-06-26 NOTE — Op Note (Signed)
OPERATIVE NOTE   PROCEDURE: 1. US guidance for vascular access, bilateral femoral arteries 2. Catheter placement into aorta from bilateral femoral approaches 3. Placement of a 26 mm diameter 18 cm length Gore Excluder Endoprosthesis main body right with a 14 mm diameter by 13.5 cm length contralateral limb 4. Placement of a 10 mm diameter x 7 cm length right iliac extension limb 5. ProGlide closure devices bilateral femoral arteries  PRE-OPERATIVE DIAGNOSIS: AAA  POST-OPERATIVE DIAGNOSIS: same  SURGEON: Leotis Pain, MD and Hortencia Pilar, MD - Co-surgeons  ANESTHESIA: General  ESTIMATED BLOOD LOSS: 25 cc  FINDING(S): 1.  AAA  SPECIMEN(S):  none  INDICATIONS:   Jeremy Higgins is a 80 y.o. male who presents with aorta and right iliac artery aneurysm of appropriate size for prophylactic repair. He has already had his right hypogastric artery embolized for the aneurysm that extended to the iliac bifurcation on the right. He is brought in for his definitive stent graft repair. Risks and benefits were discussed and informed consent was obtained.  DESCRIPTION: After obtaining full informed written consent, the patient was brought back to the operating room and placed supine upon the operating table.  The patient received IV antibiotics prior to induction.  After obtaining adequate anesthesia, the patient was prepped and draped in the standard fashion for endovascular AAA repair.  We then began by gaining access to both femoral arteries with US guidance with me working on the left and Dr. Delana Meyer working on the right.  The femoral arteries were found to be patent and accessed without difficulty with a needle under ultrasound guidance without difficulty on each side and permanent images were recorded.  We then placed 2 proglide devices on each side in a pre-close fashion and placed 8 French sheaths. The patient was then given  5000 units of intravenous heparin. The Pigtail catheter was placed  into the aorta from the  right side. Using this image, we selected a 26 mm diameter 18 cm length Main body device.  Over a stiff wire, an 29 French sheath was placed. The main body was then placed through the 18 French sheath on the right side. A Kumpe catheter was placed up the left side and a magnified image at the renal arteries was performed. The main body was then deployed just below the lowest renal artery in a configuration to allow the easiest cannulation of the contralateral gate, which was in a crossed configuration. The Kumpe catheter was used to cannulate the contralateral gate without difficulty and successful cannulation was confirmed by twirling the pigtail catheter in the main body. We then placed a stiff wire and a retrograde arteriogram was performed through the left femoral sheath. We upsized to the 12 [place] sheath for the contralateral limb and a 14 mm diameter by 13.5 cm length left iliac limb was selected and deployed. The main body deployment was then completed. Based off the angiographic findings, extension limbs were necessary.  A 10 mm diameter by 7 cm length iliac extender was taken down to the right external iliac artery to obtain seal in the normal external iliac artery with the hypogastric artery already having been embolized. All junction points and seals zones were treated with the compliant balloon. For some constraint just below the flow divider in the area of the contralateral gate, 12 mm diameter kissing balloons were used from both sheaths to help expand the graft with good success.  The pigtail catheter was then replaced and a completion angiogram  was performed. No endoleak was detected on completion angiography. The renal arteries were found to be widely patent. The left hypogastric artery was found to be widely patent. At this point we elected to terminate the procedure. We secured the pro glide devices for hemostasis on the femoral arteries. The skin incision was closed with  a 4-0 Monocryl. Dermabond and pressure dressing were placed. The patient was taken to the recovery room in stable condition having tolerated the procedure well.  COMPLICATIONS: none  CONDITION: stable  DEW,JASON  06/26/2015, 9:42 AM

## 2015-06-26 NOTE — Progress Notes (Signed)
   06/26/15 1300  Clinical Encounter Type  Visited With Patient and family together  Visit Type Initial;Spiritual support  Consult/Referral To Chaplain  Spiritual Encounters  Spiritual Needs Emotional  Stress Factors  Patient Stress Factors None identified  Family Stress Factors None identified  Chaplain rounded in the unit and offered a compassionate presence and offered support to patient and family as applicable. Chaplain Carrel Leather A. Aashka Salomone Ext. [phone]

## 2015-06-26 NOTE — Transfer of Care (Signed)
Immediate Anesthesia Transfer of Care Note  Patient: Jeremy Higgins  Procedure(s) Performed: Procedure(s): Endovascular Repair/Stent Graft (N/A)  Patient Location: PACU  Anesthesia Type:General  Level of Consciousness: awake and patient cooperative  Airway & Oxygen Therapy: Patient Spontanous Breathing and Patient connected to nasal cannula oxygen  Post-op Assessment: Report given to RN and Post -op Vital signs reviewed and stable  Post vital signs: Reviewed and stable  Last Vitals:  Filed Vitals:   06/26/15 0641 06/26/15 0955  BP: 114/74 133/73  Pulse: 80 75  Temp: 36.5 C 37 C  Resp: 19 15    Complications: No apparent anesthesia complications

## 2015-06-26 NOTE — Anesthesia Procedure Notes (Signed)
Procedure Name: Intubation Date/Time: 06/26/2015 8:09 AM Performed by: Rosaria Ferries, Jag Lenz Pre-anesthesia Checklist: Patient identified, Emergency Drugs available, Suction available and Patient being monitored Patient Re-evaluated:Patient Re-evaluated prior to inductionOxygen Delivery Method: Circle system utilized Preoxygenation: Pre-oxygenation with 100% oxygen Intubation Type: IV induction Laryngoscope Size: Mac and 3 Grade View: Grade I Tube type: Oral Tube size: 7.0 mm Number of attempts: 1 Secured at: 23 cm Tube secured with: Tape Dental Injury: Teeth and Oropharynx as per pre-operative assessment

## 2015-06-26 NOTE — Op Note (Signed)
[facility] History & Physical Update  The patient was interviewed and re-examined.  The patient's previous History and Physical has been reviewed and is unchanged.  There is no change in the plan of care. We plan to proceed with the scheduled procedure.  Karlina Suares, Dolores Lory, MD  06/26/2015, 8:17 AM

## 2015-06-26 NOTE — Op Note (Signed)
OPERATIVE NOTE   PROCEDURE: 1. US guidance for vascular access, bilateral femoral arteries 2. Catheter placement into aorta from bilateral femoral approaches 3. Placement of a C3 Gore Excluder Endoprosthesis main body 26 x 12 x 18 with a 14 x 14 contralateral limb 4. Placement of a Gore iliac extender 10 x 7 right iliac system 5. ProGlide closure devices bilateral femoral arteries  PRE-OPERATIVE DIAGNOSIS: AAA  POST-OPERATIVE DIAGNOSIS: same  SURGEON: Hortencia Pilar, MD and Leotis Pain, MD - Co-surgeons  ANESTHESIA: general  ESTIMATED BLOOD LOSS: 25 cc  FINDING(S): 1.  AAA  SPECIMEN(S):  none  INDICATIONS:   Jeremy Higgins is a 80 y.o. male who presents with known nominal and bilateral common iliac artery aneurysms. His right common iliac artery aneurysm is now greater than 3.5 cm and therefore even though his abdominal component does not exceed 5 cm his iliac artery aneurysm is so large heel is at risk for lethal rupture. He is therefore undergoing endovascular repair. The risks and benefits of been reviewed all questions of been answered the patient has been coiled on the right in the internal iliac artery in preparation for aneurysm repair done well with this procedure. The patient wishes to proceed with endovascular repair.  DESCRIPTION: After obtaining full informed written consent, the patient was brought back to the operating room and placed supine upon the operating table.  The patient received IV antibiotics prior to induction.  After obtaining adequate anesthesia, the patient was prepped and draped in the standard fashion for endovascular AAA repair.  We then began by gaining access to both femoral arteries with US guidance with me working on the right and Dr. Lucky Cowboy working on the left.  The femoral arteries were found to be patent and accessed without difficulty with a needle under ultrasound guidance without difficulty on each side and permanent images were recorded.  We  then placed 2 proglide devices on each side in a pre-close fashion and placed 8 French sheaths. The patient was then given  5000 units of intravenous heparin. The Pigtail catheter was placed into the aorta from the  right side. Using this image, we selected a 26 x 12 x 18 Main body device.  Over a stiff wire, an 34 French sheath was placed. The main body was then placed through the 18 French sheath. A Kumpe catheter was placed up the left side and a magnified image at the renal arteries was performed. The main body was then deployed just below the lowest renal artery which was the left in this case. The Kumpe catheter and glide wire was used to cannulate the contralateral gate without difficulty and successful cannulation was confirmed by twirling the pigtail catheter in the main body. We then placed a stiff wire and a retrograde arteriogram was performed through the left femoral sheath. We upsized to the 12 [place] sheath for the contralateral limb and a 14 x 14 limb was selected and deployed. The main body deployment was then completed. Based off the angiographic findings, extension was necessary on the right.  A 10 x 7 limb was then opened and prepped then delivered through the right femoral sheath and deployed without difficulty. All junction points and seals zones were treated with the compliant balloon. The flow divider as well as the aortic bifurcation were then treated with 12 x 4 angioplasty balloons inflated for 15-30 seconds at 12 atm to ensure that the stent graft was fully expanded in these locations. The pigtail catheter was  then replaced and a completion angiogram was performed.   No Endoleak was detected on completion angiography. The renal arteries were found to be widely patent. At this point we elected to terminate the procedure. We secured the pro glide devices for hemostasis on the femoral arteries. The skin incision was closed with a 4-0 Monocryl. Dermabond and pressure dressing were placed. The  patient was taken to the recovery room in stable condition having tolerated the procedure well.  COMPLICATIONS: none  CONDITION: stable  Jeremy Higgins  11/17/2014, 3:51 PM

## 2015-06-26 NOTE — Anesthesia Preprocedure Evaluation (Addendum)
Anesthesia Evaluation  Patient identified by MRN, date of birth, ID band Patient awake    Reviewed: Allergy & Precautions, NPO status , Patient's Chart, lab work & pertinent test results  Airway Mallampati: III  TM Distance: <3 FB Neck ROM: Limited  Mouth opening: Limited Mouth Opening  Dental  (+) Teeth Intact   Pulmonary COPD,  COPD inhaler, former smoker,    Pulmonary exam normal        Cardiovascular Exercise Tolerance: Good hypertension, Pt. on medications and Pt. on home beta blockers + Peripheral Vascular Disease  Normal cardiovascular exam     Neuro/Psych    GI/Hepatic   Endo/Other    Renal/GU      Musculoskeletal  (+) Arthritis , Osteoarthritis,    Abdominal Normal abdominal exam  (+)   Peds  Hematology HBb 15.4, plts 147, O+, C&T x 2 units. On Eliquis.   Anesthesia Other Findings   Reproductive/Obstetrics                            Anesthesia Physical Anesthesia Plan  ASA: III  Anesthesia Plan: General   Post-op Pain Management:    Induction: Intravenous  Airway Management Planned: Oral ETT  Additional Equipment: Arterial line  Intra-op Plan:   Post-operative Plan: Extubation in OR  Informed Consent: I have reviewed the patients History and Physical, chart, labs and discussed the procedure including the risks, benefits and alternatives for the proposed anesthesia with the patient or authorized representative who has indicated his/her understanding and acceptance.     Plan Discussed with: CRNA  Anesthesia Plan Comments:         Anesthesia Quick Evaluation

## 2015-06-26 NOTE — Anesthesia Postprocedure Evaluation (Signed)
Anesthesia Post Note  Patient: Jeremy Higgins  Procedure(s) Performed: Procedure(s) (LRB): Endovascular Repair/Stent Graft (N/A)  Patient location during evaluation: PACU Anesthesia Type: General Level of consciousness: awake and alert Pain management: pain level controlled Vital Signs Assessment: post-procedure vital signs reviewed and stable Respiratory status: spontaneous breathing Cardiovascular status: blood pressure returned to baseline Postop Assessment: no headache Anesthetic complications: no    Last Vitals:  Filed Vitals:   06/26/15 0641 06/26/15 0955  BP: 114/74 133/73  Pulse: 80 75  Temp: 36.5 C 37 C  Resp: 19 15    Last Pain: There were no vitals filed for this visit.               Dshawn Mcnay M

## 2015-06-27 ENCOUNTER — Encounter: Payer: Self-pay | Admitting: Vascular Surgery

## 2015-06-27 LAB — TYPE AND SCREEN
ABO/RH(D): O POS
ANTIBODY SCREEN: NEGATIVE
UNIT DIVISION: 0
Unit division: 0

## 2015-06-27 LAB — CBC
HEMATOCRIT: 34.2 % — AB (ref 40.0–52.0)
HEMOGLOBIN: 11.2 g/dL — AB (ref 13.0–18.0)
MCH: 28.3 pg (ref 26.0–34.0)
MCHC: 32.7 g/dL (ref 32.0–36.0)
MCV: 86.4 fL (ref 80.0–100.0)
Platelets: 174 10*3/uL (ref 150–440)
RBC: 3.96 MIL/uL — ABNORMAL LOW (ref 4.40–5.90)
RDW: 14.2 % (ref 11.5–14.5)
WBC: 10.8 10*3/uL — ABNORMAL HIGH (ref 3.8–10.6)

## 2015-06-27 LAB — BASIC METABOLIC PANEL
Anion gap: 5 (ref 5–15)
BUN: 17 mg/dL (ref 6–20)
CHLORIDE: 101 mmol/L (ref 101–111)
CO2: 28 mmol/L (ref 22–32)
CREATININE: 0.95 mg/dL (ref 0.61–1.24)
Calcium: 8 mg/dL — ABNORMAL LOW (ref 8.9–10.3)
GFR calc non Af Amer: >60 mL/min (ref >60)
Glucose, Bld: 121 mg/dL — ABNORMAL HIGH (ref 65–99)
Potassium: 4.5 mmol/L (ref 3.5–5.1)
Sodium: 134 mmol/L — ABNORMAL LOW (ref 135–145)

## 2015-06-27 MED ORDER — TAMSULOSIN HCL 0.4 MG PO CAPS
0.4000 mg | ORAL_CAPSULE | Freq: Once | ORAL | Status: DC
  Filled 2015-06-27: qty 1

## 2015-06-27 NOTE — Care Management (Signed)
Triple A repair 12/14.  Anticipate discharge home today.  He will be discharged home on Eliquis and informed this is not a new drug.  Patient and his family confirm patient was on this medication prior to admission

## 2015-06-27 NOTE — Progress Notes (Addendum)
[facility]  Daily Progress Note  Subjective: 1 Day Post-Op: US guidance for vascular access, bilateral femoral arteries, catheter placement into aorta from bilateral femoral approaches, placement of a C3 Gore Excluder Endoprosthesis main body 26 x 12 x 18 with a 14 x 14 contralateral limb and placement of a Gore iliac extender 10 x 7 right iliac system with ProGlide closure devices bilateral femoral arteries.  Patient without complaint this AM. Sitting up in bed cleaning him self. Looking forward to discharge home. Tolerating diet, pain well controlled, making urine.   Objective: Filed Vitals:   06/27/15 0300 06/27/15 0400 06/27/15 0700 06/27/15 0730  BP: 97/62 92/60 93/59  121/58  Pulse: 96 86 92   Temp:    98.8 F (37.1 C)  TempSrc:    Oral  Resp: 19 23 25    Height:      Weight:      SpO2: 92% 96% 96%     Intake/Output Summary (Last 24 hours) at 06/27/15 0959 Last data filed at 06/27/15 0600  Gross per 24 hour  Intake   1485 ml  Output   1325 ml  Net    160 ml   Physical Exam: A&Ox3, NAD CV: RRR Pulmonary: CTA Bilaterally Abdomen: Soft, Nontender, Nondistended Groin: Bilateral pressure devices intact, no bleeding, no swelling noted. Vascular: bilateral extremities warm, pedal pulses present, minimal edema noted.   Laboratory: CBC    Component Value Date/Time   WBC 10.8* 06/27/2015 0354   HGB 11.2* 06/27/2015 0354   HCT 34.2* 06/27/2015 0354   PLT 174 06/27/2015 0354   BMET    Component Value Date/Time   NA 134* 06/27/2015 0354   K 4.5 06/27/2015 0354   CL 101 06/27/2015 0354   CO2 28 06/27/2015 0354   GLUCOSE 121* 06/27/2015 0354   BUN 17 06/27/2015 0354   CREATININE 0.95 06/27/2015 0354   CALCIUM 8.0* 06/27/2015 0354   GFRNONAA >60 06/27/2015 0354   GFRAA >60 06/27/2015 0354   Assessment/Planning: 80 year old male s/p endovascular AAA repair - doing well 1) OK to d/c home 2) Discussed with Dr. Eber Hong Precilla Purnell  PA-C 06/27/2015 9:59 AM  Patient failed trial of void, straight cath'd with 900cc - will hold off on discharge. Patient advance to regular diet, given dose of flomax, if passes trial of void - may be discharged home, if not foley to be reinserted with leg back and patient to follow up with urology.

## 2015-06-29 ENCOUNTER — Encounter: Payer: Self-pay | Admitting: Emergency Medicine

## 2015-06-29 ENCOUNTER — Inpatient Hospital Stay
Admission: EM | Admit: 2015-06-29 | Discharge: 2015-07-03 | DRG: 919 | Disposition: A | Discharge status: Home / Self Care | Payer: Medicare Other | Attending: Vascular Surgery | Admitting: Vascular Surgery

## 2015-06-29 ENCOUNTER — Emergency Department: Payer: Medicare Other

## 2015-06-29 DIAGNOSIS — I739 Peripheral vascular disease, unspecified: Secondary | ICD-10-CM | POA: Diagnosis present

## 2015-06-29 DIAGNOSIS — I2510 Atherosclerotic heart disease of native coronary artery without angina pectoris: Secondary | ICD-10-CM | POA: Diagnosis present

## 2015-06-29 DIAGNOSIS — R109 Unspecified abdominal pain: Secondary | ICD-10-CM | POA: Diagnosis present

## 2015-06-29 DIAGNOSIS — N401 Enlarged prostate with lower urinary tract symptoms: Secondary | ICD-10-CM | POA: Diagnosis present

## 2015-06-29 DIAGNOSIS — D649 Anemia, unspecified: Secondary | ICD-10-CM | POA: Diagnosis present

## 2015-06-29 DIAGNOSIS — K219 Gastro-esophageal reflux disease without esophagitis: Secondary | ICD-10-CM | POA: Diagnosis present

## 2015-06-29 DIAGNOSIS — Y929 Unspecified place or not applicable: Secondary | ICD-10-CM | POA: Diagnosis not present

## 2015-06-29 DIAGNOSIS — K661 Hemoperitoneum: Secondary | ICD-10-CM | POA: Diagnosis present

## 2015-06-29 DIAGNOSIS — R000 Tachycardia, unspecified: Secondary | ICD-10-CM | POA: Diagnosis present

## 2015-06-29 DIAGNOSIS — H409 Unspecified glaucoma: Secondary | ICD-10-CM | POA: Diagnosis present

## 2015-06-29 DIAGNOSIS — K5900 Constipation, unspecified: Secondary | ICD-10-CM | POA: Diagnosis present

## 2015-06-29 DIAGNOSIS — R58 Hemorrhage, not elsewhere classified: Secondary | ICD-10-CM | POA: Diagnosis present

## 2015-06-29 DIAGNOSIS — R338 Other retention of urine: Secondary | ICD-10-CM | POA: Diagnosis present

## 2015-06-29 DIAGNOSIS — I97618 Postprocedural hemorrhage and hematoma of a circulatory system organ or structure following other circulatory system procedure: Secondary | ICD-10-CM | POA: Diagnosis present

## 2015-06-29 DIAGNOSIS — I482 Chronic atrial fibrillation: Secondary | ICD-10-CM | POA: Diagnosis present

## 2015-06-29 DIAGNOSIS — M1990 Unspecified osteoarthritis, unspecified site: Secondary | ICD-10-CM | POA: Diagnosis present

## 2015-06-29 DIAGNOSIS — J449 Chronic obstructive pulmonary disease, unspecified: Secondary | ICD-10-CM | POA: Diagnosis not present

## 2015-06-29 DIAGNOSIS — Z87891 Personal history of nicotine dependence: Secondary | ICD-10-CM | POA: Diagnosis not present

## 2015-06-29 DIAGNOSIS — T45515A Adverse effect of anticoagulants, initial encounter: Secondary | ICD-10-CM | POA: Diagnosis present

## 2015-06-29 DIAGNOSIS — I10 Essential (primary) hypertension: Secondary | ICD-10-CM | POA: Diagnosis present

## 2015-06-29 DIAGNOSIS — Y832 Surgical operation with anastomosis, bypass or graft as the cause of abnormal reaction of the patient, or of later complication, without mention of misadventure at the time of the procedure: Secondary | ICD-10-CM | POA: Diagnosis present

## 2015-06-29 DIAGNOSIS — M25551 Pain in right hip: Secondary | ICD-10-CM | POA: Diagnosis present

## 2015-06-29 DIAGNOSIS — D62 Acute posthemorrhagic anemia: Secondary | ICD-10-CM | POA: Diagnosis not present

## 2015-06-29 DIAGNOSIS — Z9889 Other specified postprocedural states: Secondary | ICD-10-CM

## 2015-06-29 DIAGNOSIS — T148XXA Other injury of unspecified body region, initial encounter: Secondary | ICD-10-CM

## 2015-06-29 DIAGNOSIS — Z7901 Long term (current) use of anticoagulants: Secondary | ICD-10-CM | POA: Diagnosis not present

## 2015-06-29 DIAGNOSIS — I9789 Other postprocedural complications and disorders of the circulatory system, not elsewhere classified: Secondary | ICD-10-CM

## 2015-06-29 LAB — CBC
HCT: 28.1 % — ABNORMAL LOW (ref 40.0–52.0)
Hemoglobin: 9.1 g/dL — ABNORMAL LOW (ref 13.0–18.0)
MCH: 27.7 pg (ref 26.0–34.0)
MCHC: 32.4 g/dL (ref 32.0–36.0)
MCV: 85.5 fL (ref 80.0–100.0)
PLATELETS: 190 10*3/uL (ref 150–440)
RBC: 3.28 MIL/uL — AB (ref 4.40–5.90)
RDW: 13.7 % (ref 11.5–14.5)
WBC: 13.1 10*3/uL — AB (ref 3.8–10.6)

## 2015-06-29 LAB — CBC WITH DIFFERENTIAL/PLATELET
Basophils Absolute: 0 10*3/uL (ref 0–0.1)
Basophils Relative: 0 %
EOS PCT: 1 %
Eosinophils Absolute: 0.1 10*3/uL (ref 0–0.7)
HCT: 32.4 % — ABNORMAL LOW (ref 40.0–52.0)
HEMOGLOBIN: 10.7 g/dL — AB (ref 13.0–18.0)
LYMPHS ABS: 0.5 10*3/uL — AB (ref 1.0–3.6)
LYMPHS PCT: 5 %
MCH: 28.4 pg (ref 26.0–34.0)
MCHC: 33.1 g/dL (ref 32.0–36.0)
MCV: 85.6 fL (ref 80.0–100.0)
Monocytes Absolute: 1.3 10*3/uL — ABNORMAL HIGH (ref 0.2–1.0)
Monocytes Relative: 13 %
NEUTROS ABS: 8.3 10*3/uL — AB (ref 1.4–6.5)
Neutrophils Relative %: 81 %
PLATELETS: 193 10*3/uL (ref 150–440)
RBC: 3.79 MIL/uL — AB (ref 4.40–5.90)
RDW: 14.4 % (ref 11.5–14.5)
WBC: 10.3 10*3/uL (ref 3.8–10.6)

## 2015-06-29 LAB — COMPREHENSIVE METABOLIC PANEL
ALK PHOS: 87 U/L (ref 38–126)
ALT: 20 U/L (ref 17–63)
AST: 26 U/L (ref 15–41)
Albumin: 3.3 g/dL — ABNORMAL LOW (ref 3.5–5.0)
Anion gap: 7 (ref 5–15)
BILIRUBIN TOTAL: 1.1 mg/dL (ref 0.3–1.2)
BUN: 18 mg/dL (ref 6–20)
CALCIUM: 8.3 mg/dL — AB (ref 8.9–10.3)
CO2: 24 mmol/L (ref 22–32)
CREATININE: 0.87 mg/dL (ref 0.61–1.24)
Chloride: 108 mmol/L (ref 101–111)
Glucose, Bld: 148 mg/dL — ABNORMAL HIGH (ref 65–99)
Potassium: 3.8 mmol/L (ref 3.5–5.1)
Sodium: 139 mmol/L (ref 135–145)
TOTAL PROTEIN: 6.3 g/dL — AB (ref 6.5–8.1)

## 2015-06-29 LAB — BASIC METABOLIC PANEL
Anion gap: 5 (ref 5–15)
BUN: 17 mg/dL (ref 6–20)
CALCIUM: 7.9 mg/dL — AB (ref 8.9–10.3)
CO2: 24 mmol/L (ref 22–32)
Chloride: 109 mmol/L (ref 101–111)
Creatinine, Ser: 0.82 mg/dL (ref 0.61–1.24)
GFR calc Af Amer: >60 mL/min (ref >60)
GLUCOSE: 138 mg/dL — AB (ref 65–99)
POTASSIUM: 4.1 mmol/L (ref 3.5–5.1)
Sodium: 138 mmol/L (ref 135–145)

## 2015-06-29 LAB — PREPARE RBC (CROSSMATCH)

## 2015-06-29 LAB — PROTIME-INR
INR: 1.46
Prothrombin Time: 17.8 seconds — ABNORMAL HIGH (ref 11.4–15.0)

## 2015-06-29 LAB — HEMOGLOBIN AND HEMATOCRIT, BLOOD
HCT: 26.4 % — ABNORMAL LOW (ref 40.0–52.0)
HCT: 25 % — ABNORMAL LOW (ref 40.0–52.0)
HCT: 22.7 % — ABNORMAL LOW (ref 40.0–52.0)
HEMOGLOBIN: 8.1 g/dL — AB (ref 13.0–18.0)
Hemoglobin: 8.4 g/dL — ABNORMAL LOW (ref 13.0–18.0)
Hemoglobin: 7.4 g/dL — ABNORMAL LOW (ref 13.0–18.0)

## 2015-06-29 LAB — APTT: APTT: 42 s — AB (ref 24–36)

## 2015-06-29 LAB — GLUCOSE, CAPILLARY
Glucose-Capillary: 147 mg/dL — ABNORMAL HIGH (ref 65–99)
Glucose-Capillary: 102 mg/dL — ABNORMAL HIGH (ref 65–99)

## 2015-06-29 LAB — MRSA PCR SCREENING: MRSA BY PCR: NEGATIVE

## 2015-06-29 LAB — MAGNESIUM: Magnesium: 1.9 mg/dL (ref 1.7–2.4)

## 2015-06-29 LAB — CK: CK TOTAL: 141 U/L (ref 49–397)

## 2015-06-29 MED ORDER — PANTOPRAZOLE SODIUM 40 MG IV SOLR
40.0000 mg | INTRAVENOUS | Status: DC
  Administered 2015-06-29 – 2015-07-01 (×3): 40 mg via INTRAVENOUS
  Filled 2015-06-29 (×3): qty 40

## 2015-06-29 MED ORDER — ONDANSETRON HCL 4 MG PO TABS: 4.0000 mg | ORAL_TABLET | Freq: Four times a day (QID) | ORAL | Status: DC | PRN

## 2015-06-29 MED ORDER — HYDROMORPHONE HCL 1 MG/ML IJ SOLN
INTRAMUSCULAR | Status: AC
  Administered 2015-06-29: 1 mg via INTRAVENOUS
  Filled 2015-06-29: qty 1

## 2015-06-29 MED ORDER — HYDROMORPHONE HCL 1 MG/ML IJ SOLN
1.0000 mg | Freq: Once | INTRAMUSCULAR | Status: AC
  Administered 2015-06-29: 1 mg via INTRAVENOUS

## 2015-06-29 MED ORDER — MAGNESIUM SULFATE 2 GM/50ML IV SOLN
2.0000 g | Freq: Every day | INTRAVENOUS | Status: DC | PRN
  Filled 2015-06-29: qty 50

## 2015-06-29 MED ORDER — MORPHINE SULFATE (PF) 2 MG/ML IV SOLN: 2.0000 mg | INTRAVENOUS | Status: DC | PRN

## 2015-06-29 MED ORDER — FUROSEMIDE 10 MG/ML IJ SOLN
10.0000 mg | Freq: Once | INTRAMUSCULAR | Status: AC
Start: 2015-06-29 — End: 2015-06-29
  Administered 2015-06-29: 10 mg via INTRAVENOUS
  Filled 2015-06-29: qty 2

## 2015-06-29 MED ORDER — OXYCODONE-ACETAMINOPHEN 5-325 MG PO TABS
1.0000 | ORAL_TABLET | ORAL | Status: DC | PRN
  Administered 2015-06-29 – 2015-07-01 (×4): 1 via ORAL
  Filled 2015-06-29 (×4): qty 1

## 2015-06-29 MED ORDER — ALUM & MAG HYDROXIDE-SIMETH 200-200-20 MG/5ML PO SUSP: 15.0000 mL | ORAL | Status: DC | PRN

## 2015-06-29 MED ORDER — SODIUM CHLORIDE 0.9 % IV BOLUS (SEPSIS)
500.0000 mL | INTRAVENOUS | Status: AC
  Administered 2015-06-29: 500 mL via INTRAVENOUS

## 2015-06-29 MED ORDER — ACETAMINOPHEN 325 MG PO TABS: 650.0000 mg | ORAL_TABLET | Freq: Four times a day (QID) | ORAL | Status: DC | PRN

## 2015-06-29 MED ORDER — LATANOPROST 0.005 % OP SOLN
1.0000 [drp] | Freq: Every day | OPHTHALMIC | Status: DC
  Administered 2015-06-29 – 2015-07-02 (×4): 1 [drp] via OPHTHALMIC
  Filled 2015-06-29 (×2): qty 2.5

## 2015-06-29 MED ORDER — MAGNESIUM HYDROXIDE 400 MG/5ML PO SUSP: 30.0000 mL | Freq: Every day | ORAL | Status: DC | PRN

## 2015-06-29 MED ORDER — SODIUM CHLORIDE 0.9 % IV SOLN
Freq: Once | INTRAVENOUS | Status: AC
  Administered 2015-06-29: 08:00:00 via INTRAVENOUS

## 2015-06-29 MED ORDER — ONDANSETRON HCL 4 MG/2ML IJ SOLN
4.0000 mg | Freq: Four times a day (QID) | INTRAMUSCULAR | Status: DC | PRN
Start: 2015-06-29 — End: 2015-07-03

## 2015-06-29 MED ORDER — ACETAMINOPHEN 650 MG RE SUPP: 650.0000 mg | Freq: Four times a day (QID) | RECTAL | Status: DC | PRN

## 2015-06-29 MED ORDER — PROTHROMBIN COMPLEX CONC HUMAN 500 UNITS IV KIT
2000.0000 [IU] | PACK | Freq: Once | Status: AC
  Administered 2015-06-29: 2000 [IU] via INTRAVENOUS
  Filled 2015-06-29: qty 80

## 2015-06-29 MED ORDER — OXYCODONE-ACETAMINOPHEN 5-325 MG PO TABS: 2.0000 | ORAL_TABLET | ORAL | Status: DC | PRN

## 2015-06-29 MED ORDER — SODIUM CHLORIDE 0.9 % IV SOLN
INTRAVENOUS | Status: DC
  Administered 2015-06-29 – 2015-06-30 (×2): via INTRAVENOUS

## 2015-06-29 MED ORDER — SODIUM CHLORIDE 0.9 % IV SOLN
Freq: Once | INTRAVENOUS | Status: AC
  Administered 2015-06-29: 20:00:00 via INTRAVENOUS

## 2015-06-29 MED ORDER — IOHEXOL 350 MG/ML SOLN
125.0000 mL | Freq: Once | INTRAVENOUS | Status: AC | PRN
  Administered 2015-06-29: 125 mL via INTRAVENOUS

## 2015-06-29 MED ORDER — DOCUSATE SODIUM 100 MG PO CAPS
100.0000 mg | ORAL_CAPSULE | Freq: Two times a day (BID) | ORAL | Status: DC
  Administered 2015-06-29 – 2015-07-03 (×8): 100 mg via ORAL
  Filled 2015-06-29 (×9): qty 1

## 2015-06-29 MED ORDER — MORPHINE SULFATE (PF) 4 MG/ML IV SOLN
4.0000 mg | Freq: Once | INTRAVENOUS | Status: AC
  Administered 2015-06-29: 4 mg via INTRAVENOUS
  Filled 2015-06-29: qty 1

## 2015-06-29 MED ORDER — POTASSIUM CHLORIDE CRYS ER 20 MEQ PO TBCR: 20.0000 meq | EXTENDED_RELEASE_TABLET | Freq: Every day | ORAL | Status: DC | PRN

## 2015-06-29 MED ORDER — ONDANSETRON HCL 4 MG/2ML IJ SOLN
4.0000 mg | Freq: Once | INTRAMUSCULAR | Status: AC
  Administered 2015-06-29: 4 mg via INTRAVENOUS
  Filled 2015-06-29: qty 2

## 2015-06-29 NOTE — Consult Note (Signed)
PULMONARY / CRITICAL CARE MEDICINE   Name: Jeremy Higgins MRN: [id number] DOB: 28-Mar-1935    ADMISSION DATE:  06/29/2015 CONSULTATION DATE:  06/29/15  REFERRING MD : Dr. Ella Jubilee   CHIEF COMPLAINT:     Retroperitoneal Bleed Reason for consult: Retroperitoneal bleed, close monitoring of hemodynamics  HISTORY OF PRESENT ILLNESS  History per chart review and patient 80 yo male with recent elective AAA repair by vascular surgery on 06/26/2015,  Past medical history of atrial fibrillation, hypertension, COPD, with a newly found right-sided retroperitoneal bleed.per chart review and surgery note, patient had an uncomplicated postoperative course of he was discharged after surgery. He was initially doing fine until Friday night when he had acute onset of right-sided hip pain which was radiating down to his legs, he denied any traumatic injuries, this is also associated with vague lower abdominal discomfort. He stated that movement makes the pain worse in his right hip, laying still makes it a little better. He is currently on Eliquis for atrial fibrillation, he has not had any similar pain/bleeding issues in the past. He denied any fever chills nausea vomiting, chest pain, shortness of breath. He presented to the ED where nursing staff noticed that he had a large bruise on his right flank and lateral abdomen, subsequent CT scan of his abdomen and pelvis showed a large retroperitoneal hematoma. Vascular surgery follow the patient, Eliquis was stopped, he was given K-sentra, and brought to the ICU for closer hemodynamic monitoring his hemoglobin is as low as 8, but he still he medically stable. Vascular has evaluated him and stated that there is no surgical intervention at this time, continued supportive care and close monitoring of his hemodynamics.   SIGNIFICANT EVENTS  12/14- AAA repair 12/16- large right-sided retroperitoneal hematoma, hemodynamically stable PAST MEDICAL HISTORY    :  Past  Medical History  Diagnosis Date  . Glaucoma   . Arthritis     right foot andankle   . Atrial fibrillation ([place]) 2013  . Aneurysm ([place])   . Hypertension   . COPD (chronic obstructive pulmonary disease) [facility])    Past Surgical History  Procedure Laterality Date  . Foot surgery Right 1950's  . Spinal fusion  2011  . Cataract extraction  2012  . Colonoscopy  2010    ? [place] MD  . Leg surgery Right   . Peripheral vascular catheterization N/A 06/18/2015    Procedure: Abdominal Aortogram w/Lower Extremity;  Surgeon: [name], MD;  Location: [facility];  Service: Cardiovascular;  Laterality: N/A;  . Peripheral vascular catheterization  06/18/2015    Procedure: Lower Extremity Intervention;  Surgeon: [name], MD;  Location: [facility];  Service: Cardiovascular;;  . Peripheral vascular catheterization N/A 06/26/2015    Procedure: Endovascular Repair/Stent Graft;  Surgeon: [name], MD;  Location: [facility];  Service: Cardiovascular;  Laterality: N/A;   Prior to Admission medications   Medication Sig Start Date End Date Taking? Authorizing Provider  diltiazem (DILTIAZEM CD) 180 MG 24 hr capsule Take 180 mg by mouth daily.   Yes Historical Provider, MD  ELIQUIS 5 MG TABS tablet Take 5 mg by mouth 2 (two) times daily.  08/16/13  Yes Historical Provider, MD  folic acid (FOLVITE) 1 MG tablet Take 1 mg by mouth daily.   Yes Historical Provider, MD  latanoprost (XALATAN) 0.005 % ophthalmic solution Apply 1 drop to eye at bedtime.   Yes Historical Provider, MD  lisinopril (PRINIVIL,ZESTRIL)  10 MG tablet Take 10 mg by mouth daily. Reported on 06/26/2015   Yes Historical Provider, MD  lovastatin (MEVACOR) 40 MG tablet Take 40 mg by mouth at bedtime.   Yes Historical Provider, MD  metoprolol tartrate (LOPRESSOR) 25 MG tablet Take 12.5 mg by mouth as needed.   Yes Historical Provider, MD  sildenafil (VIAGRA) 100 MG tablet Take 100 mg by mouth as needed  for erectile dysfunction.   Yes Historical Provider, MD  vitamin C (ASCORBIC ACID) 500 MG tablet Take 500 mg by mouth daily.   Yes Historical Provider, MD   No Known Allergies   FAMILY HISTORY   History reviewed. No pertinent family history.    SOCIAL HISTORY    reports that he quit smoking about 16 years ago. He has never used smokeless tobacco. He reports that he drinks alcohol. He reports that he does not use illicit drugs.  Review of Systems  Constitutional: Negative for fever, chills, weight loss, malaise/fatigue and diaphoresis.  Eyes: Negative for blurred vision and double vision.  Respiratory: Negative for cough, hemoptysis, sputum production, shortness of breath and wheezing.   Cardiovascular: Negative for chest pain and palpitations.  Gastrointestinal: Negative for heartburn and nausea.  Genitourinary: Positive for flank pain.  Musculoskeletal: Positive for joint pain.  Skin: Negative for itching and rash.  Neurological: Negative for dizziness, tingling and tremors.  Endo/Heme/Allergies: Positive for environmental allergies. Bruises/bleeds easily.  Psychiatric/Behavioral: Negative for depression. The patient is not nervous/anxious.       VITAL SIGNS    Temp:  [97.9 F (36.6 C)-98.6 F (37 C)] 97.9 F (36.6 C) (12/17 0800) Pulse Rate:  [95-109] 95 (12/17 0800) Resp:  [14-29] 21 (12/17 0800) BP: (98-164)/(63-78) 109/65 mmHg (12/17 0800) SpO2:  [90 %-100 %] 100 % (12/17 0800) Weight:  [182 lb (82.555 kg)] 182 lb (82.555 kg) (12/17 0118) HEMODYNAMICS:   VENTILATOR SETTINGS:   INTAKE / OUTPUT:  Intake/Output Summary (Last 24 hours) at 06/29/15 0819 Last data filed at 06/29/15 0800  Gross per 24 hour  Intake    170 ml  Output      0 ml  Net    170 ml       PHYSICAL EXAM   Physical Exam  Constitutional: He appears well-developed and well-nourished.  HENT:  Head: Normocephalic and atraumatic.  Right Ear: External ear normal.  Left Ear: External  ear normal.  Nose: Nose normal.  Mouth/Throat: Oropharynx is clear and moist. No oropharyngeal exudate.  Eyes: Conjunctivae and EOM are normal. Pupils are equal, round, and reactive to light. Right eye exhibits no discharge. Left eye exhibits discharge.  Neck: Normal range of motion. Neck supple. No tracheal deviation present. No thyromegaly present.  Cardiovascular: Normal rate, regular rhythm and intact distal pulses.   Pulmonary/Chest: Effort normal and breath sounds normal. No respiratory distress. He has no wheezes. He has no rales. He exhibits no tenderness.  Abdominal: Soft. There is tenderness.  Moderate tenderness in R flank  Musculoskeletal: Normal range of motion. He exhibits edema.  +1 Bilateral LE edema  Neurological: He is alert. He has normal reflexes. No cranial nerve deficit.  Skin: Skin is warm and dry.  R flank erythema/bruise   Nursing note and vitals reviewed.      LABS   LABS:  CBC  Recent Labs Lab 06/27/15 0354 06/29/15 0154 06/29/15 0558  WBC 10.8* 10.3 13.1*  HGB 11.2* 10.7* 9.1*  HCT 34.2* 32.4* 28.1*  PLT 174 193 190   Coag's  Recent Labs Lab 06/29/15 0154  APTT 42*  INR 1.46   BMET  Recent Labs Lab 06/27/15 0354 06/29/15 0154 06/29/15 0558  NA 134* 139 138  K 4.5 3.8 4.1  CL 101 108 109  CO2 28 24 24   BUN 17 18 17   CREATININE 0.95 0.87 0.82  GLUCOSE 121* 148* 138*   Electrolytes  Recent Labs Lab 06/27/15 0354 06/29/15 0154 06/29/15 0558  CALCIUM 8.0* 8.3* 7.9*  MG  --   --  1.9   Sepsis Markers No results for input(s): LATICACIDVEN, PROCALCITON, O2SATVEN in the last 168 hours. ABG No results for input(s): PHART, PCO2ART, PO2ART in the last 168 hours. Liver Enzymes  Recent Labs Lab 06/29/15 0154  AST 26  ALT 20  ALKPHOS 87  BILITOT 1.1  ALBUMIN 3.3*   Cardiac Enzymes No results for input(s): TROPONINI, PROBNP in the last 168 hours. Glucose  Recent Labs Lab 06/26/15 1114 06/29/15 0553  GLUCAP 93  147*     Recent Results (from the past 240 hour(s))  MRSA PCR Screening     Status: None   Collection Time: 06/29/15  5:54 AM  Result Value Ref Range Status   MRSA by PCR NEGATIVE NEGATIVE Final    Comment:        The GeneXpert MRSA Assay (FDA approved for NASAL specimens only), is one component of a comprehensive MRSA colonization surveillance program. It is not intended to diagnose MRSA infection nor to guide or monitor treatment for MRSA infections.      Current facility-administered medications:  .  0.9 %  sodium chloride infusion, , Intravenous, Continuous, Kimberly A Stegmayer, PA-C, Last Rate: 100 mL/hr at 06/29/15 [id number] .  acetaminophen (TYLENOL) tablet 650 mg, 650 mg, Oral, Q6H PRN **OR** acetaminophen (TYLENOL) suppository 650 mg, 650 mg, Rectal, Q6H PRN, Janalyn Harder Stegmayer, PA-C .  alum & mag hydroxide-simeth (MAALOX/MYLANTA) 200-200-20 MG/5ML suspension 15-30 mL, 15-30 mL, Oral, Q2H PRN, [name], MD .  docusate sodium (COLACE) capsule 100 mg, 100 mg, Oral, BID, Kimberly A Stegmayer, PA-C .  magnesium hydroxide (MILK OF MAGNESIA) suspension 30 mL, 30 mL, Oral, Daily PRN, Kimberly A Stegmayer, PA-C .  magnesium sulfate IVPB 2 g 50 mL, 2 g, Intravenous, Daily PRN, [name], MD .  morphine 2 MG/ML injection 2 mg, 2 mg, Intravenous, Q4H PRN, Kimberly A Stegmayer, PA-C .  ondansetron (ZOFRAN) tablet 4 mg, 4 mg, Oral, Q6H PRN **OR** ondansetron (ZOFRAN) injection 4 mg, 4 mg, Intravenous, Q6H PRN, Kimberly A Stegmayer, PA-C .  oxyCODONE-acetaminophen (PERCOCET/ROXICET) 5-325 MG per tablet 1 tablet, 1 tablet, Oral, Q4H PRN, Kimberly A Stegmayer, PA-C .  oxyCODONE-acetaminophen (PERCOCET/ROXICET) 5-325 MG per tablet 2 tablet, 2 tablet, Oral, Q4H PRN, Kimberly A Stegmayer, PA-C .  pantoprazole (PROTONIX) injection 40 mg, 40 mg, Intravenous, Q24H, Kimberly A Stegmayer, PA-C, 40 mg at 06/29/15 [id number] .  potassium chloride SA (K-DUR,KLOR-CON) CR tablet 20-40 mEq,  20-40 mEq, Oral, Daily PRN, [name], MD  IMAGING    Ct Angio Abdomen W/cm &/or Wo Contrast  06/29/2015  CLINICAL DATA:  Status post abdominal aortic aneurysm stent placement 3 days prior, now with right hip pain. Tender hematoma of right flank. EXAM: CT ANGIOGRAPHY ABDOMEN TECHNIQUE: Multidetector CT imaging of the abdomen was performed using the standard protocol during bolus administration of intravenous contrast. Multiplanar reconstructed images including MIPs were obtained and reviewed to evaluate the vascular anatomy. CONTRAST:  117mL OMNIPAQUE IOHEXOL 350 MG/ML SOLN COMPARISON:  Preoperative CT 06/03/2015  FINDINGS: Large right retroperitoneal hematoma with heterogeneous stranding and soft tissue density in the right retroperitoneal space. Hematoma tracks laterally about the extraperitoneal space adjacent to the iliacus musculature. There is mild soft tissue stranding extending into the right inguinal canal. No pseudoaneurysm formation. Tiny focus of hyperdensity within the hematoma, series 5, image 162, likely reflects small focus of active bleeding. Aorto bi-iliac stent placement aneurysm sac measures 4.5 cm, unchanged. No extraluminal contrast within the aneurysm sac to suggest endoleak. Small focus of air is likely postsurgical. Bilateral common iliac arteries are patent. Set celiac, superior mesenteric, and inferior mesenteric arteries are patent. Single bilateral renal arteries are patent. Mild atelectasis in the lingula, lung bases otherwise clear. Multiple cysts in the liver. The gallbladder, spleen, pancreas and adrenal glands are unchanged in appearance with small subcentimeter left adrenal myelolipoma. Small hiatal hernia, stomach is decompressed. No small bowel dilatation. Moderate colonic stool without colonic wall thickening. Distal colonic diverticulosis without diverticulitis. Bladder is mildly distended. Prostate gland appears prominent. Trace dependent free fluid in the  pelvis. Postsurgical and degenerative change throughout the lumbar spine. Sclerosis involving sacrum, unchanged. Advanced degenerative change of both hips. Atrophy of the right gluteal musculature. Review of the MIP images confirms the above findings. IMPRESSION: 1. Large right retroperitoneal hematoma. Suspect small focus of active bleeding. 2. Post aorta bi-iliac stent placement with unchanged size of the aneurysm sac. No evidence of endoleak. These results were called by telephone at the time of interpretation on 06/29/2015 at 3:42 am to Dr. Hinda Kehr , who verbally acknowledged these results. Electronically Signed   By: Jeb Levering M.D.   On: 06/29/2015 03:44   Dg Hip Unilat  With Pelvis 2-3 Views Right  06/29/2015  CLINICAL DATA:  Lateral right hip pain for 3 days, worse this morning. No trauma. EXAM: DG HIP (WITH OR WITHOUT PELVIS) 2-3V RIGHT COMPARISON:  None. FINDINGS: Prominent degenerative changes in both hips with diffuse narrowing of the acetabular joint space, sclerosis, and hypertrophic changes on both sides of the joint. The fragments suggested at the medial aspect of the right acetabular joint. This could represent hypertrophic change or loose body. No evidence of acute fracture or dislocation in the right hip. No destructive bone lesions. Pelvis appears intact. SI joints and symphysis pubis are not displaced. Postoperative changes in the lower lumbar spine. Aortoiliac stent grafts. Vascular calcifications. IMPRESSION: Prominent degenerative changes in both hips. Possible loose body in the right hip. No acute fracture or dislocation. Electronically Signed   By: Lucienne Capers M.D.   On: 06/29/2015 02:00      Indwelling Urinary Catheter continued, requirement due to   Reason to continue Indwelling Urinary Catheter for strict Intake/Output monitoring for hemodynamic instability   Central Line continued, requirement due to   Reason to continue [employer] Monitoring of central  venous pressure or other hemodynamic parameters   Ventilator continued, requirement due to, resp failure    Ventilator Sedation RASS 0 to -2   Cultures: BCx2  UC  Sputum  Antibiotics:  Lines:   ASSESSMENT/PLAN   80 year old male past medical history of coronary artery disease, atrial fibrillation, mild COPD, recent AAA repair, now with large right-sided retroperitoneal hematoma, he medically stable.  Retroperitoneal hematoma -Seen and evaluated by vascular surgery, per their recommendations this is most likely spontaneously in combination with being on anticoagulation for atrial fibrillation. Anticoagulation has been stopped, he given reversal agent. Currently has no surgical intervention since patient is medically stable and his hemoglobin is stabilized,  ie >7 -Continue with serial hemoglobin checks -Maintain hemoglobin greater than 7.  Anticipate drop in Hb, then stabilization.  -Monitor hemodynamics -Patient currently medically stable -Pain control  Atrial fibrillation-previously on anticoagulation now stopped due to large retroperitoneal hematoma -Currently rate controlled -Continue at antihypertensive medications for systolics greater than 123456, this also rate control his atrial fibrillation -Once a retroperitoneal hematoma has resolved, may need to restart anticoagulation in the near future  Hypertension -Resume oral antihypertensive medications for systolics greater than 123456 -Continue with hemodynamic monitoring  COPD-mild -Review of his outpatient chart shows that he's not currently requiring any bronchodilators -Albuterol as needed -Stable and mild COPD not requiring ICS/LABA  Patient is hemodynamically stable, not requiring ICU status, we will place in step down status and continue to follow along/monitor patient's status. Further medical management deferred to internal medicine. Thank you for consulting [facility]    I have personally  obtained a history, examined the patient, evaluated laboratory and imaging results, formulated the assessment and plan and placed orders.   Pulmonary/critical care consult time-45 minutes  Vilinda Boehringer, MD [facility] Pager [phone] (please enter 7-digits) On Call Pager [phone] (please enter 7-digits)     06/29/2015, 8:19 AM  Note: This note was prepared with Dragon dictation along with smaller phrase technology. Any transcriptional errors that result from this process are unintentional.

## 2015-06-29 NOTE — Progress Notes (Signed)
eLink Physician-Brief Progress Note Patient Name: Jeremy Higgins DOB: 1935-06-09 MRN: [id number]   Date of Service  06/29/2015  HPI/Events of Note  89 M with h/o of HTN s/p recent stent of AAA presenting with severe radiating right hip and leg pain.  HD stable but found to have a large right retroperitoneal bleed.  This was felt to be spontaneous as patient is on Eliquis for AF.  Hbg so far is stable.  Patient is alert, HD stable with sats of 98% in no distress.  eICU Interventions  Plan of care per primary admitting physician. Continue to monitor via [facility]     Intervention Category Evaluation Type: New Patient Evaluation  DETERDING,ELIZABETH 06/29/2015, 6:01 AM

## 2015-06-29 NOTE — Progress Notes (Signed)
[facility] Progress Note Patient Name: Jeremy Higgins DOB: 06/08/1935 MRN: [id number]   Date of Service  06/29/2015  HPI/Events of Note  Request for order for Latanoprost eye gtts that the patient is on at home.  eICU Interventions  Will order: 1. Latanoprost 0.0005% 1 drop to both eyes Q HS.     Intervention Category Minor Interventions: Routine modifications to care plan (e.g. PRN medications for pain, fever)  Joie Reamer [place] 06/29/2015, 9:58 PM

## 2015-06-29 NOTE — ED Notes (Signed)
Patient presents to Emergency Department via EMS with complaints of right hip pain, pt s/p AAA stent placement on 06/26/2015.  Pt takes eliquis, denies SOB, chest pain, or dizziness.  Pt pain 8/10.

## 2015-06-29 NOTE — Progress Notes (Signed)
[facility]  Daily Progress Note   Subjective: Patient admitted last night for probable spontaneous retroperitoneal hemorrhage.   He is without complaint this AM. Sitting in bed eating soup.  Objective: Filed Vitals:   06/29/15 0600 06/29/15 0700 06/29/15 0800 06/29/15 0900  BP: 110/67 98/63 109/65 104/83  Pulse: 103 95 95 90  Temp:   97.9 F (36.6 C)   TempSrc:   Oral   Resp: 17 14 21 19   Height:      Weight:      SpO2: 99% 97% 100% 98%    Intake/Output Summary (Last 24 hours) at 06/29/15 0945 Last data filed at 06/29/15 0800  Gross per 24 hour  Intake    170 ml  Output      0 ml  Net    170 ml    Physical Exam: A&Ox3, NAD CV: Irregular Pulmonary: CTA Bilaterally Abdomen: Soft, Nontender, Nondistended Groin: Bilateral: No swelling / drainage Vascular:  Left Lower Extremity: warm, non-tender with palpable pedal pulses   Right Lower Extremity: warm, non-tender with palpable pedal pulses  Laboratory: CBC    Component Value Date/Time   WBC 13.1* 06/29/2015 0558   HGB 9.1* 06/29/2015 0558   HCT 28.1* 06/29/2015 0558   PLT 190 06/29/2015 0558   BMET    Component Value Date/Time   NA 138 06/29/2015 0558   K 4.1 06/29/2015 0558   CL 109 06/29/2015 0558   CO2 24 06/29/2015 0558   GLUCOSE 138* 06/29/2015 0558   BUN 17 06/29/2015 0558   CREATININE 0.82 06/29/2015 0558   CALCIUM 7.9* 06/29/2015 0558   GFRNONAA >60 06/29/2015 0558   GFRAA >60 06/29/2015 0558   Assessment/Planning: 80 year old male admitted last night probable spontaneous retroperitoneal hemorrhage 1) Anti-coagulation held. Serial H&H - latest Hgb 9.1 down from previous 10.7 - would not transfuse until Hbg 8.5 - four units requested on hold. 2) DVT prophylaxis patient is on SCDs and TED hose no anticoagulation given his bleeding 3) Continue best rest until Monday or Hbg stable. 4) GI proph - Protonix 5) Bowel Regimen 6) Pain Control 7) Discussed with Dr.  Eber Hong Jaiyon Wander PA-C 06/29/2015 9:45 AM

## 2015-06-29 NOTE — H&P (Signed)
[facility] Admission History & Physical  MRN : [id number]  Jeremy Higgins is a 80 y.o. (1935-06-25) male who presents with chief complaint of  Chief Complaint  Patient presents with  . Hip Pain  .  History of Present Illness: The patient is s/p elective AAA repair by my self and Dr Lucky Cowboy this past Wednesday;3-4 days ago. He had a normal post operative course and was DC'd the day after surgery.  He has been doing fine since then but late Friday night he had relatively acute in onset of pain in his right hip which is now radiating down to his leg. He has not had any traumatic injuries. He is on Eliquis for A-fib. He has not had similar problems in the past. He is also having some vague lower abdominal discomfort/pain. Movement makes the pain in his right hip worse and lying still makes it a little bit better but it is still rated as severe. He denies fever/chills, nausea, vomiting, chest pain, shortness of breath. He was unaware of it, but after he came to the emergency department the nurses discovered that he has a large bruise on his right flank and right lateral abdomen. He also has a small amount of bruising around his umbilicus. He has not had any lightheadedness or dizziness and has not passed out.  CT scan shows a large right pelvic retroperitoneal hematoma.  Current Facility-Administered Medications  Medication Dose Route Frequency Provider Last Rate Last Dose  . 0.9 %  sodium chloride infusion   Intravenous Once Hinda Kehr, MD       Current Outpatient Prescriptions  Medication Sig Dispense Refill  . diltiazem (DILTIAZEM CD) 180 MG 24 hr capsule Take 180 mg by mouth daily.    Marland Kitchen ELIQUIS 5 MG TABS tablet Take 5 mg by mouth 2 (two) times daily.     . folic acid (FOLVITE) 1 MG tablet Take 1 mg by mouth daily.    Marland Kitchen latanoprost (XALATAN) 0.005 % ophthalmic solution Apply 1 drop to eye at bedtime.    Marland Kitchen lisinopril (PRINIVIL,ZESTRIL) 10 MG tablet Take 10 mg  by mouth daily. Reported on 06/26/2015    . lovastatin (MEVACOR) 40 MG tablet Take 40 mg by mouth at bedtime.    . metoprolol tartrate (LOPRESSOR) 25 MG tablet Take 12.5 mg by mouth as needed.    . sildenafil (VIAGRA) 100 MG tablet Take 100 mg by mouth as needed for erectile dysfunction.    . vitamin C (ASCORBIC ACID) 500 MG tablet Take 500 mg by mouth daily.      Past Medical History  Diagnosis Date  . Glaucoma   . Arthritis     right foot andankle   . Atrial fibrillation ([place]) 2013  . Aneurysm ([place])   . Hypertension   . COPD (chronic obstructive pulmonary disease) [facility])     Past Surgical History  Procedure Laterality Date  . Foot surgery Right 1950's  . Spinal fusion  2011  . Cataract extraction  2012  . Colonoscopy  2010    ? [name]  . Leg surgery Right   . Peripheral vascular catheterization N/A 06/18/2015    Procedure: Abdominal Aortogram w/Lower Extremity;  Surgeon: [name], MD;  Location: [facility];  Service: Cardiovascular;  Laterality: N/A;  . Peripheral vascular catheterization  06/18/2015    Procedure: Lower Extremity Intervention;  Surgeon: [name], MD;  Location: [facility];  Service: Cardiovascular;;  . Peripheral  vascular catheterization N/A 06/26/2015    Procedure: Endovascular Repair/Stent Graft;  Surgeon: [name], MD;  Location: [facility];  Service: Cardiovascular;  Laterality: N/A;    Social History Social History  Substance Use Topics  . Smoking status: Former Smoker -- 67 years    Quit date: 07/13/1998  . Smokeless tobacco: Never Used  . Alcohol Use: Yes     Comment: very rare    Family History History reviewed. No pertinent family history.  No history of porphyria autoimmune disease or bleeding disorders  No Known Allergies   REVIEW OF SYSTEMS (Negative unless checked)  Constitutional: [] Weight loss  [] Fever  [] Chills Cardiac: [] Chest pain   [] Chest pressure   [x] Palpitations    [] Shortness of breath when laying flat   [] Shortness of breath at rest   [] Shortness of breath with exertion. Vascular:  [] Pain in legs with walking   [] Pain in legs at rest   [] Pain in legs when laying flat   [] Claudication   [] Pain in feet when walking  [] Pain in feet at rest  [] Pain in feet when laying flat   [] History of DVT   [] Phlebitis   [] Swelling in legs   [] Varicose veins   [] Non-healing ulcers Pulmonary:   [] Uses home oxygen   [] Productive cough   [] Hemoptysis   [] Wheeze  [] COPD   [] Asthma Neurologic:  [] Dizziness  [] Blackouts   [] Seizures   [] History of stroke   [] History of TIA  [] Aphasia   [] Temporary blindness   [] Dysphagia   [] Weakness or numbness in arms   [] Weakness or numbness in legs Musculoskeletal:  [] Arthritis   [] Joint swelling   [] Joint pain   [] Low back pain Hematologic:  [] Easy bruising  [] Easy bleeding   [] Hypercoagulable state   [] Anemic  [] Hepatitis Gastrointestinal:  [] Blood in stool   [] Vomiting blood  [] Gastroesophageal reflux/heartburn   [] Difficulty swallowing. Genitourinary:  [] Chronic kidney disease   [] Difficult urination  [] Frequent urination  [] Burning with urination   [] Blood in urine Skin:  [] Rashes   [] Ulcers   [] Wounds Psychological:  [] History of anxiety   []  History of major depression.  Physical Examination  Filed Vitals:   06/29/15 0200 06/29/15 0230 06/29/15 0353 06/29/15 0435  BP: 157/64 139/78 148/71 119/67  Pulse: 102 106 105 103  Temp:    98.1 F (36.7 C)  TempSrc:    Oral  Resp: 29 21 20 16   Height:      Weight:      SpO2: 94% 95% 90% 95%   Body mass index is 25.4 kg/(m^2). Gen: WD/WN, NAD Head: [place]/AT, No temporalis wasting.  Ear/Nose/Throat: Hearing grossly intact, nares w/o erythema or drainage, oropharynx w/o Erythema/Exudate, Eyes: PERRLA, EOMI.  Neck: Supple, no nuchal rigidity.  No bruit or JVD.  Pulmonary:  Good air movement, clear to auscultation bilaterally, no increased work of respiration or use of accessory muscles   Cardiac: RRR, normal S1, S2, no Murmurs, rubs or gallops. Vascular: groins are clean dry and intact no hematoma  Vessel Right Left  Radial Palpable Palpable  Ulnar Palpable Palpable  Brachial Palpable Palpable  Carotid Palpable, without bruit Palpable, without bruit  Aorta Not palpable N/A  Femoral Palpable Palpable  Popliteal Palpable Palpable  PT Palpable Palpable  DP Palpable Palpable   Gastrointestinal: soft, mild distention with tenderness right side, ecchymosis of the right flank and periumbilical area Musculoskeletal: M/S 5/5 throughout.  No deformity or atrophy. Neurologic: CN 2-12 intact. Pain and light touch intact in extremities.  Symmetrical.  Speech is fluent. Motor exam as listed above. Psychiatric: Judgment intact, Mood & affect appropriate for pt's clinical situation. Dermatologic: No rashes or ulcers noted.  No cellulitis or open wounds. Lymph : No Cervical, Axillary, or Inguinal lymphadenopathy.   CBC Lab Results  Component Value Date   WBC 10.3 06/29/2015   HGB 10.7* 06/29/2015   HCT 32.4* 06/29/2015   MCV 85.6 06/29/2015   PLT 193 06/29/2015    BMET    Component Value Date/Time   NA 139 06/29/2015 0154   K 3.8 06/29/2015 0154   CL 108 06/29/2015 0154   CO2 24 06/29/2015 0154   GLUCOSE 148* 06/29/2015 0154   BUN 18 06/29/2015 0154   CREATININE 0.87 06/29/2015 0154   CALCIUM 8.3* 06/29/2015 0154   GFRNONAA >60 06/29/2015 0154   GFRAA >60 06/29/2015 0154   Estimated Creatinine Clearance: 74.5 mL/min (by C-G formula based on Cr of 0.87).  COAG Lab Results  Component Value Date   INR 1.46 06/29/2015   INR 1.08 06/18/2015    Radiology Chest 2 View  06/19/2015  CLINICAL DATA:  Preop AAA and common iliac artery aneurysm repair EXAM: CHEST  2 VIEW COMPARISON:  12/05/2009 FINDINGS: Lungs are clear.  No pleural effusion or pneumothorax. The heart is normal in size. Degenerative changes of the visualized thoracolumbar spine. IMPRESSION: No evidence of  acute cardiopulmonary disease. Electronically Signed   By: Julian Hy M.D.   On: 06/19/2015 08:10   Ct Angio Abdomen W/cm &/or Wo Contrast  06/29/2015  CLINICAL DATA:  Status post abdominal aortic aneurysm stent placement 3 days prior, now with right hip pain. Tender hematoma of right flank. EXAM: CT ANGIOGRAPHY ABDOMEN TECHNIQUE: Multidetector CT imaging of the abdomen was performed using the standard protocol during bolus administration of intravenous contrast. Multiplanar reconstructed images including MIPs were obtained and reviewed to evaluate the vascular anatomy. CONTRAST:  138mL OMNIPAQUE IOHEXOL 350 MG/ML SOLN COMPARISON:  Preoperative CT 06/03/2015 FINDINGS: Large right retroperitoneal hematoma with heterogeneous stranding and soft tissue density in the right retroperitoneal space. Hematoma tracks laterally about the extraperitoneal space adjacent to the iliacus musculature. There is mild soft tissue stranding extending into the right inguinal canal. No pseudoaneurysm formation. Tiny focus of hyperdensity within the hematoma, series 5, image 162, likely reflects small focus of active bleeding. Aorto bi-iliac stent placement aneurysm sac measures 4.5 cm, unchanged. No extraluminal contrast within the aneurysm sac to suggest endoleak. Small focus of air is likely postsurgical. Bilateral common iliac arteries are patent. Set celiac, superior mesenteric, and inferior mesenteric arteries are patent. Single bilateral renal arteries are patent. Mild atelectasis in the lingula, lung bases otherwise clear. Multiple cysts in the liver. The gallbladder, spleen, pancreas and adrenal glands are unchanged in appearance with small subcentimeter left adrenal myelolipoma. Small hiatal hernia, stomach is decompressed. No small bowel dilatation. Moderate colonic stool without colonic wall thickening. Distal colonic diverticulosis without diverticulitis. Bladder is mildly distended. Prostate gland appears  prominent. Trace dependent free fluid in the pelvis. Postsurgical and degenerative change throughout the lumbar spine. Sclerosis involving sacrum, unchanged. Advanced degenerative change of both hips. Atrophy of the right gluteal musculature. Review of the MIP images confirms the above findings. IMPRESSION: 1. Large right retroperitoneal hematoma. Suspect small focus of active bleeding. 2. Post aorta bi-iliac stent placement with unchanged size of the aneurysm sac. No evidence of endoleak. These results were called by telephone at the time of interpretation on 06/29/2015 at 3:42 am to Dr. Hinda Kehr ,  who verbally acknowledged these results. Electronically Signed   By: Jeb Levering M.D.   On: 06/29/2015 03:44   Dg Hip Unilat  With Pelvis 2-3 Views Right  06/29/2015  CLINICAL DATA:  Lateral right hip pain for 3 days, worse this morning. No trauma. EXAM: DG HIP (WITH OR WITHOUT PELVIS) 2-3V RIGHT COMPARISON:  None. FINDINGS: Prominent degenerative changes in both hips with diffuse narrowing of the acetabular joint space, sclerosis, and hypertrophic changes on both sides of the joint. The fragments suggested at the medial aspect of the right acetabular joint. This could represent hypertrophic change or loose body. No evidence of acute fracture or dislocation in the right hip. No destructive bone lesions. Pelvis appears intact. SI joints and symphysis pubis are not displaced. Postoperative changes in the lower lumbar spine. Aortoiliac stent grafts. Vascular calcifications. IMPRESSION: Prominent degenerative changes in both hips. Possible loose body in the right hip. No acute fracture or dislocation. Electronically Signed   By: Lucienne Capers M.D.   On: 06/29/2015 02:00   Ct Angio Abd/pel W/ And/or W/o  06/03/2015  CLINICAL DATA:  Evaluate known abdominal aortic aneurysm. EXAM: CT ANGIOGRAPHY ABDOMEN AND PELVIS WITH CONTRAST TECHNIQUE: Multidetector CT imaging of the abdomen and pelvis was performed using  the standard protocol during bolus administration of intravenous contrast. Multiplanar reconstructed images including MIPs were obtained and reviewed to evaluate the vascular anatomy. CONTRAST:  155mL OMNIPAQUE IOHEXOL 350 MG/ML SOLN COMPARISON:  Aortic ultrasound - 03/11/2011; CTA of the abdomen pelvis - 04/06/2013 FINDINGS: Vascular Findings: Abdominal aorta: Interval enlargement in size infrarenal abdominal aortic aneurysm, now measuring 4.6 x 4.7 x 4.7 cm as measured in greatest oblique short axis axial (image 116, series 4), coronal (coronal image 61, series 8) and sagittal (sagittal image 95, series 9) previously, 3.8 x 4.2 x 4.1 cm. The cranial aspect of the aneurysm originates approximately 5.8 cm caudal to the take-off of the most inferior left renal artery. The aneurysm is again noted to extends to asymmetrically involve the proximal aspect of the left common iliac artery. There is a persistent moderate amount of eccentric mixed calcified and noncalcified slightly irregular thrombus throughout the dominant component of the aneurysm. No abdominal aortic dissection or periaortic stranding. Celiac artery: There is a minimal amount of eccentric mixed calcified and noncalcified atherosclerotic plaque involving the cranial aspect of the origin of the celiac artery, not definitely resulting in a hemodynamically significant stenosis. The left hepatic artery is incidentally noted to arise from the left gastric artery. Otherwise, conventional branching pattern. SMA: There is a minimal amount of eccentric mixed calcified and noncalcified atherosclerotic plaque involving the origin of the SMA, not resulting in hemodynamically significant stenosis. Conventional branching pattern. The distal tributaries of the SMA are widely patent without discrete intraluminal filling defect to suggest distal embolism. Right Renal artery: Solitary; there is a minimal amount of eccentric mixed calcified atherosclerotic plaque involving  the origin the right renal artery, not resulting in hemodynamically significant stenosis. No definitive vessel irregularity to suggest system deep. Left Renal artery: Solitary; widely patent without hemodynamically significant narrowing. No vessel irregularity to suggest FMD. IMA: Diseased at its origin though remains patent. Right-sided pelvic vasculature: As above, there is extension of the infrarenal abdominal aortic aneurysm to involve the proximal approximately 3 cm of the right common iliac artery. There is a moderate amount of eccentric mixed calcified and noncalcified atherosclerotic plaque throughout the right common iliac artery, not resulting in a hemodynamically significant stenosis. The right internal iliac artery  is heavily diseased though patent and of normal caliber. There is a moderate amount of eccentric mixed calcified and noncalcified atherosclerotic plaque throughout the right external iliac artery, not definitely resulting in hemodynamically significant stenosis. Left-sided pelvic vasculature: There is a moderate amount of eccentric mixed calcified and noncalcified atherosclerotic plaque throughout the left common and external iliac arteries, not definitely resulting in hemodynamically significant stenosis. The left internal iliac artery is heavily disease though patent and of normal caliber. Review of the MIP images confirms the above findings. -------------------------------------------------------------------------------- Nonvascular Findings: Evaluation of the abdominal organs is largely limited to the arterial phase of enhancement. Normal hepatic contour. Re- demonstrated multiple hepatic cysts with dominant cyst within the medial segment of the left lobe of the liver measuring 5.2 cm in diameter (image 42, series 4). Additional scattered sub cm hypoattenuating hepatic lesions are too small to adequately characterize though morphologically similar to the 03/2013 examination and favored to  represent additional hepatic cysts. Normal appearance of the gallbladder given degree distention. No radiopaque gallstones. No intra extrahepatic biliary duct dilatation. No ascites. There is symmetric enhancement of the bilateral kidneys. No definite renal stones on this postcontrast examination. Scattered subcentimeter hypoattenuating left-sided renal lesions are too small likely characterize though favored to represent renal cysts. No discrete right-sided renal lesions. No urinary obstruction or perinephric stranding. Normal appearance of the right adrenal gland, pancreas and spleen. Unchanged approximately 1 cm fat containing adrenal myelolipoma within the crux of the left adrenal gland (image 56, series 4). Colonic diverticulosis without evidence diverticulitis. Moderate colonic stool burden without evidence of enteric obstruction. Normal appearance of the terminal ileum and appendix. No pneumoperitoneum, pneumatosis or portal venous gas. No bulky retroperitoneal, mesenteric, pelvic or inguinal lymphadenopathy. Borderline enlargement of the prostate gland with mass effect upon the undersurface of the urinary bladder. Normal appearance of the urinary bladder given degree of distention. No free fluid in the pelvic cul-de-sac. Normal heart size.  No pericardial effusion. No acute or aggressive osseous abnormalities. Post L4 and L5 paraspinal fusion and intervertebral disc space replacement, without evidence of hardware failure loosening. Sclerotic lesion within the sacrum is unchanged since the 03/2013 examination and thus favored to be benign etiology, likely a bone island (representative sagittal image 101, series 9). Mild to moderate multilevel lumbar spine DDD. Mild-to-moderate bilateral hip degenerative change, left greater than right. Small bilateral mesenteric fat containing inguinal hernias, left greater than right. Tiny mesenteric fat containing periumbilical hernia. IMPRESSION: Vascular Impression: 1.  Interval increase in size of infrarenal abdominal aortic aneurysm, now measuring 4.7 cm in diameter, previously, 4.2 cm. The aneurysm is again noted to arise approximately 5.8 cm caudal to the take-off of the most inferior left renal artery and extend to involve the proximal 3 cm of the right common iliac artery. No evidence of abdominal aortic dissection or periaortic stranding. Nonvascular Impression: 1. Colonic diverticulosis without evidence of diverticulitis. Electronically Signed   By: Sandi Mariscal M.D.   On: 06/03/2015 12:22    Assessment/Plan 1.  Retroperitoneal hemorrhage: Given the location as well as his anticoagulation for his atrial fibrillation it appears this is more of a spontaneous retroperitoneal hemorrhage. There does not appear to be any abnormality of the stent and both of the femoral puncture sites appear to be well sealed. Both of the common femorals are well visualized on the CT and there is no evidence whatsoever of communication from this site with the hematoma. I will admit the patient to the ICU he will be typed and  crossed for blood and serial hemoglobins will be drawn. Hemodynamics will be monitored. He will be placed at bed rest. It is my intention to treat this nonoperatively. There is no role for intervention in this particular case given the coil embolization of the right internal iliac prior to his aneurysm repair thus blocking any catheter directed treatments into the right pelvic area. Critical care will also be consult.  K-Centrum has already been given. His antihypertensives as well as his anticoagulation will be held. 2.  Atrial fibrillation: Patient will be monitored in the CCU at this time his anticoagulation must be stopped until a better sense of his bleeding is obtained I will hold his antihypertensives however should his systolic pressures rise persistently above 120 then his home medications will be restarted. 3.  Hypertension: As noted above patient  antihypertensives will be held for the time being and will be restarted once his hemodynamics are stabilized. 4. DVT prophylaxis patient is on SCDs and TED hose no anticoagulation given his bleeding.   Lavoris Sparling, Dolores Lory, MD  06/29/2015 5:19 AM

## 2015-06-29 NOTE — ED Notes (Signed)
Patient transported to CT 

## 2015-06-29 NOTE — Progress Notes (Signed)
Per Maudie Mercury, PA and dr schnier, orders given to transfuse 2 units of PRBCs with 10mg  of lasix in between doses.

## 2015-06-29 NOTE — ED Provider Notes (Signed)
[facility] Emergency Department Provider Note  ____________________________________________  Time seen: Approximately 2:26 AM  I have reviewed the triage vital signs and the nursing notes.   HISTORY  Chief Complaint Hip Pain    HPI Jeremy Higgins is a 80 y.o. male who had an elective AAA repair by Dr. Delana Meyer 3-4 days ago.  He has been doing fine since then but several hours ago he had relatively acute in onset of pain in his right hip which is now radiating down to his leg.  He has not had any traumatic injuries.  He is on Eliquis.  He has not had similar problems in the past.  He is also having some vague lower abdominal discomfort/pain.  Movement makes the pain in his right hip worse and lying still makes it a little bit better but it is still rated as severe.  He denies fever/chills, nausea, vomiting, chest pain, shortness of breath.  He was unaware of it, but after he came to the emergency department the nurses discovered that he has a large bruise on his right flank and right lateral abdomen.  He also has a small amount of bruising around his umbilicus.  He has not had any lightheadedness or dizziness and has not passed out.   Past Medical History  Diagnosis Date  . Glaucoma   . Arthritis     right foot andankle   . Atrial fibrillation ([place]) 2013  . Aneurysm ([place])   . Hypertension   . COPD (chronic obstructive pulmonary disease) [facility])     Patient Active Problem List   Diagnosis Date Noted  . AAA (abdominal aortic aneurysm) ([place]) 06/26/2015  . Carotid stenosis 09/12/2013  . Hypertension     Past Surgical History  Procedure Laterality Date  . Foot surgery Right 1950's  . Spinal fusion  2011  . Cataract extraction  2012  . Colonoscopy  2010    ? [place] MD  . Leg surgery Right   . Peripheral vascular catheterization N/A 06/18/2015    Procedure: Abdominal Aortogram w/Lower Extremity;  Surgeon: [name], MD;  Location: [place];   Service: Cardiovascular;  Laterality: N/A;  . Peripheral vascular catheterization  06/18/2015    Procedure: Lower Extremity Intervention;  Surgeon: [name], MD;  Location: [facility];  Service: Cardiovascular;;  . Peripheral vascular catheterization N/A 06/26/2015    Procedure: Endovascular Repair/Stent Graft;  Surgeon: [name], MD;  Location: [facility];  Service: Cardiovascular;  Laterality: N/A;    Current Outpatient Rx  Name  Route  Sig  Dispense  Refill  . diltiazem (DILTIAZEM CD) 180 MG 24 hr capsule   Oral   Take 180 mg by mouth daily.         Marland Kitchen ELIQUIS 5 MG TABS tablet   Oral   Take 5 mg by mouth 2 (two) times daily.          . folic acid (FOLVITE) 1 MG tablet   Oral   Take 1 mg by mouth daily.         Marland Kitchen latanoprost (XALATAN) 0.005 % ophthalmic solution   Ophthalmic   Apply 1 drop to eye at bedtime.         Marland Kitchen lisinopril (PRINIVIL,ZESTRIL) 10 MG tablet   Oral   Take 10 mg by mouth daily. Reported on 06/26/2015         . lovastatin (MEVACOR) 40 MG tablet   Oral  Take 40 mg by mouth at bedtime.         . metoprolol tartrate (LOPRESSOR) 25 MG tablet   Oral   Take 12.5 mg by mouth as needed.         . sildenafil (VIAGRA) 100 MG tablet   Oral   Take 100 mg by mouth as needed for erectile dysfunction.         . vitamin C (ASCORBIC ACID) 500 MG tablet   Oral   Take 500 mg by mouth daily.           Allergies Review of patient's allergies indicates no known allergies.  History reviewed. No pertinent family history.  Social History Social History  Substance Use Topics  . Smoking status: Former Smoker -- 29 years    Quit date: 07/13/1998  . Smokeless tobacco: Never Used  . Alcohol Use: Yes     Comment: very rare    Review of Systems Constitutional: No fever/chills Eyes: No visual changes. ENT: No sore throat. Cardiovascular: Denies chest pain. Respiratory: Denies shortness of  breath. Gastrointestinal: lower abd pain .  No nausea, no vomiting.  No diarrhea.  No constipation. Genitourinary: Negative for dysuria. Musculoskeletal: Negative for back pain.  R hip pain radiating to upper leg Skin: Negative for rash. Large bruise on R lateral abdomen/flank (of which patient was unaware) Neurological: Negative for headaches, focal weakness or numbness.  10-point ROS otherwise negative.  ____________________________________________   PHYSICAL EXAM:  VITAL SIGNS: ED Triage Vitals  Enc Vitals Group     BP 06/29/15 0118 164/78 mmHg     Pulse Rate 06/29/15 0118 97     Resp 06/29/15 0118 18     Temp 06/29/15 0118 98.1 F (36.7 C)     Temp Source 06/29/15 0118 Oral     SpO2 06/29/15 0109 95 %     Weight 06/29/15 0118 182 lb (82.555 kg)     Height 06/29/15 0118 5\' 11"  (1.803 m)     Head Cir --      Peak Flow --      Pain Score 06/29/15 0119 8     Pain Loc --      Pain Edu? --      Excl. in [place]? --     Constitutional: Alert and oriented. Well appearing and in no acute distress. Eyes: Conjunctivae are normal. PERRL. EOMI. Head: Atraumatic. Nose: No congestion/rhinnorhea. Mouth/Throat: Mucous membranes are moist.  Oropharynx non-erythematous. Neck: No stridor.   Cardiovascular: Borderline tachycardia, regular rhythm. Grossly normal heart sounds.  Good peripheral circulation Respiratory: Normal respiratory effort.  No retractions. Lungs CTAB. Gastrointestinal: Soft with moderate TTP of lower abdomen. No distention. No abdominal bruits. No CVA tenderness. Musculoskeletal: No lower extremity tenderness nor edema.  No joint effusions. Neurologic:  Normal speech and language. No gross focal neurologic deficits are appreciated. N/V intact throughout lower extremities. Skin:  Skin is warm, dry and intact. Large firm hematoma to right flank, tender to palpation.  Also infraumbilical bruising (mild) Psychiatric: Mood and affect are normal. Speech and behavior are  normal.  ____________________________________________   LABS (all labs ordered are listed, but only abnormal results are displayed)  Labs Reviewed  CBC WITH DIFFERENTIAL/PLATELET - Abnormal; Notable for the following:    RBC 3.79 (*)    Hemoglobin 10.7 (*)    HCT 32.4 (*)    Neutro Abs 8.3 (*)    Lymphs Abs 0.5 (*)    Monocytes Absolute 1.3 (*)    All  other components within normal limits  COMPREHENSIVE METABOLIC PANEL - Abnormal; Notable for the following:    Glucose, Bld 148 (*)    Calcium 8.3 (*)    Total Protein 6.3 (*)    Albumin 3.3 (*)    All other components within normal limits  PROTIME-INR - Abnormal; Notable for the following:    Prothrombin Time 17.8 (*)    All other components within normal limits  APTT - Abnormal; Notable for the following:    aPTT 42 (*)    All other components within normal limits  CK  TYPE AND SCREEN  PREPARE RBC (CROSSMATCH)   ____________________________________________  EKG  ED ECG REPORT I, Reta Norgren, the attending physician, personally viewed and interpreted this ECG.  Date: 06/29/2015 EKG Time: 1:50 Rate: 104 Rhythm: Sinus tachycardia QRS Axis: normal Intervals: normal ST/T Wave abnormalities: normal Conduction Disutrbances: none Narrative Interpretation: unremarkable  ____________________________________________  RADIOLOGY  Bennie Hind, Adara Kittle, personally discussed these images and results by phone with the on-call radiologist and used this discussion as part of my medical decision making.    Ct Angio Abdomen W/cm &/or Wo Contrast  06/29/2015  CLINICAL DATA:  Status post abdominal aortic aneurysm stent placement 3 days prior, now with right hip pain. Tender hematoma of right flank. EXAM: CT ANGIOGRAPHY ABDOMEN TECHNIQUE: Multidetector CT imaging of the abdomen was performed using the standard protocol during bolus administration of intravenous contrast. Multiplanar reconstructed images including MIPs were obtained and  reviewed to evaluate the vascular anatomy. CONTRAST:  125mL OMNIPAQUE IOHEXOL 350 MG/ML SOLN COMPARISON:  Preoperative CT 06/03/2015 FINDINGS: Large right retroperitoneal hematoma with heterogeneous stranding and soft tissue density in the right retroperitoneal space. Hematoma tracks laterally about the extraperitoneal space adjacent to the iliacus musculature. There is mild soft tissue stranding extending into the right inguinal canal. No pseudoaneurysm formation. Tiny focus of hyperdensity within the hematoma, series 5, image 162, likely reflects small focus of active bleeding. Aorto bi-iliac stent placement aneurysm sac measures 4.5 cm, unchanged. No extraluminal contrast within the aneurysm sac to suggest endoleak. Small focus of air is likely postsurgical. Bilateral common iliac arteries are patent. Set celiac, superior mesenteric, and inferior mesenteric arteries are patent. Single bilateral renal arteries are patent. Mild atelectasis in the lingula, lung bases otherwise clear. Multiple cysts in the liver. The gallbladder, spleen, pancreas and adrenal glands are unchanged in appearance with small subcentimeter left adrenal myelolipoma. Small hiatal hernia, stomach is decompressed. No small bowel dilatation. Moderate colonic stool without colonic wall thickening. Distal colonic diverticulosis without diverticulitis. Bladder is mildly distended. Prostate gland appears prominent. Trace dependent free fluid in the pelvis. Postsurgical and degenerative change throughout the lumbar spine. Sclerosis involving sacrum, unchanged. Advanced degenerative change of both hips. Atrophy of the right gluteal musculature. Review of the MIP images confirms the above findings. IMPRESSION: 1. Large right retroperitoneal hematoma. Suspect small focus of active bleeding. 2. Post aorta bi-iliac stent placement with unchanged size of the aneurysm sac. No evidence of endoleak. These results were called by telephone at the time of  interpretation on 06/29/2015 at 3:42 am to Dr. Hinda Kehr , who verbally acknowledged these results. Electronically Signed   By: Jeb Levering M.D.   On: 06/29/2015 03:44   Dg Hip Unilat  With Pelvis 2-3 Views Right  06/29/2015  CLINICAL DATA:  Lateral right hip pain for 3 days, worse this morning. No trauma. EXAM: DG HIP (WITH OR WITHOUT PELVIS) 2-3V RIGHT COMPARISON:  None. FINDINGS: Prominent degenerative changes in  both hips with diffuse narrowing of the acetabular joint space, sclerosis, and hypertrophic changes on both sides of the joint. The fragments suggested at the medial aspect of the right acetabular joint. This could represent hypertrophic change or loose body. No evidence of acute fracture or dislocation in the right hip. No destructive bone lesions. Pelvis appears intact. SI joints and symphysis pubis are not displaced. Postoperative changes in the lower lumbar spine. Aortoiliac stent grafts. Vascular calcifications. IMPRESSION: Prominent degenerative changes in both hips. Possible loose body in the right hip. No acute fracture or dislocation. Electronically Signed   By: Lucienne Capers M.D.   On: 06/29/2015 02:00    ____________________________________________   PROCEDURES  Procedure(s) performed: None  Critical Care performed: Yes, see critical care note(s)   CRITICAL CARE Performed by: Hinda Kehr   Total critical care time: 45 minutes  Critical care time was exclusive of separately billable procedures and treating other patients.  Critical care was necessary to treat or prevent imminent or life-threatening deterioration.  Critical care was time spent personally by me on the following activities: development of treatment plan with patient and/or surrogate as well as nursing, discussions with consultants, evaluation of patient's response to treatment, examination of patient, obtaining history from patient or surrogate, ordering and performing treatments and  interventions, ordering and review of laboratory studies, ordering and review of radiographic studies, pulse oximetry and re-evaluation of patient's condition.  ____________________________________________   INITIAL IMPRESSION / ASSESSMENT AND PLAN / ED COURSE  Pertinent labs & imaging results that were available during my care of the patient were reviewed by me and considered in my medical decision making (see chart for details).  Concern for intra-abdominal bleeding given the recent aortic surgery.  I am checking basic labs and sending him emergently for CT angiography of his abdomen and pelvis to look for signs of hematoma as well as active extravasation.  Hemodynamically stable at this time.  IV fluid bolus.   (Note that documentation was delayed due to multiple ED patients requiring immediate care.)   The radiologist called me to let me know that he has a large retroperitoneal hematoma with some signs of active bleeding although the bleeding is away from any of the large blood vessels.  Given that the patient is on Eliquis I called the pharmacist and discussed with him the appropriate dosing of K-Centra reverse the Eliquis.  I also consented the patient for blood products and type and screened 2 units.  I called Dr. Delana Meyer by phone and he came to the emergency department to personally evaluate the patient.  I also discussed the case with him in person.  He is admitting the patient for further management.  The patient remained dynamically stable with only slight tachycardia and I updated the patient and his family about the results.  ____________________________________________  FINAL CLINICAL IMPRESSION(S) / ED DIAGNOSES  Final diagnoses:  Hematoma  Right hip pain  History of AAA (abdominal aortic aneurysm) repair  Abdominal pain  Nontraumatic retroperitoneal hematoma  Other postoperative complication involving circulatory system      NEW MEDICATIONS STARTED DURING THIS  VISIT:  New Prescriptions   No medications on file     Hinda Kehr, MD 06/29/15 [phone]

## 2015-06-30 ENCOUNTER — Encounter: Payer: Self-pay | Admitting: Specialist

## 2015-06-30 DIAGNOSIS — I10 Essential (primary) hypertension: Secondary | ICD-10-CM

## 2015-06-30 LAB — PROTIME-INR
INR: 1.23
PROTHROMBIN TIME: 15.7 s — AB (ref 11.4–15.0)

## 2015-06-30 LAB — BASIC METABOLIC PANEL
Anion gap: 3 — ABNORMAL LOW (ref 5–15)
BUN: 14 mg/dL (ref 6–20)
CHLORIDE: 111 mmol/L (ref 101–111)
CO2: 24 mmol/L (ref 22–32)
Calcium: 7.4 mg/dL — ABNORMAL LOW (ref 8.9–10.3)
Creatinine, Ser: 0.82 mg/dL (ref 0.61–1.24)
GFR calc non Af Amer: >60 mL/min (ref >60)
Glucose, Bld: 97 mg/dL (ref 65–99)
Potassium: 3.9 mmol/L (ref 3.5–5.1)
SODIUM: 138 mmol/L (ref 135–145)

## 2015-06-30 LAB — MAGNESIUM: MAGNESIUM: 1.7 mg/dL (ref 1.7–2.4)

## 2015-06-30 LAB — CBC
HCT: 26.9 % — ABNORMAL LOW (ref 40.0–52.0)
Hemoglobin: 8.8 g/dL — ABNORMAL LOW (ref 13.0–18.0)
MCH: 28.6 pg (ref 26.0–34.0)
MCHC: 32.9 g/dL (ref 32.0–36.0)
MCV: 86.8 fL (ref 80.0–100.0)
PLATELETS: 159 10*3/uL (ref 150–440)
RBC: 3.09 MIL/uL — ABNORMAL LOW (ref 4.40–5.90)
RDW: 14 % (ref 11.5–14.5)
WBC: 7.8 10*3/uL (ref 3.8–10.6)

## 2015-06-30 LAB — HEMOGLOBIN AND HEMATOCRIT, BLOOD
HCT: 27.6 % — ABNORMAL LOW (ref 40.0–52.0)
HCT: 29.4 % — ABNORMAL LOW (ref 40.0–52.0)
HCT: 29.9 % — ABNORMAL LOW (ref 40.0–52.0)
HEMATOCRIT: 28.6 % — AB (ref 40.0–52.0)
HEMOGLOBIN: 9.6 g/dL — AB (ref 13.0–18.0)
HEMOGLOBIN: 9.9 g/dL — AB (ref 13.0–18.0)
Hemoglobin: 9.2 g/dL — ABNORMAL LOW (ref 13.0–18.0)
Hemoglobin: 9.8 g/dL — ABNORMAL LOW (ref 13.0–18.0)

## 2015-06-30 LAB — APTT: APTT: 38 s — AB (ref 24–36)

## 2015-06-30 MED ORDER — POLYETHYLENE GLYCOL 3350 17 G PO PACK
17.0000 g | PACK | Freq: Once | ORAL | Status: AC
  Administered 2015-06-30: 17 g via ORAL
  Filled 2015-06-30: qty 1

## 2015-06-30 MED ORDER — MAGNESIUM SULFATE 2 GM/50ML IV SOLN
2.0000 g | Freq: Once | INTRAVENOUS | Status: AC
  Administered 2015-06-30: 2 g via INTRAVENOUS
  Filled 2015-06-30: qty 50

## 2015-06-30 MED ORDER — TAMSULOSIN HCL 0.4 MG PO CAPS
0.4000 mg | ORAL_CAPSULE | Freq: Every day | ORAL | Status: DC
  Administered 2015-06-30 – 2015-07-02 (×3): 0.4 mg via ORAL
  Filled 2015-06-30 (×3): qty 1

## 2015-06-30 MED ORDER — MAGNESIUM CITRATE PO SOLN
1.0000 | Freq: Once | ORAL | Status: AC
  Administered 2015-06-30: 1 via ORAL
  Filled 2015-06-30: qty 296

## 2015-06-30 MED ORDER — DILTIAZEM HCL ER COATED BEADS 180 MG PO CP24
180.0000 mg | ORAL_CAPSULE | Freq: Every day | ORAL | Status: DC
  Administered 2015-06-30 – 2015-07-03 (×4): 180 mg via ORAL
  Filled 2015-06-30 (×4): qty 1

## 2015-06-30 NOTE — Consult Note (Signed)
[facility] Consultation Note  Patient ID: UNK LYBECK, MRN: [id number], DOB/AGE: [birth year] 80 y.o. Admit date: 06/29/2015   Date of Consult: 06/30/2015 Primary Physician: Marcello Fennel, MD Primary Cardiologist: Nehemiah Massed  Chief Complaint:  Chief Complaint  Patient presents with  . Hip Pain   Reason for Consult: atrial fibrillation with essential hypertension  HPI: 80 y.o. male with known peripheral vascular disease having an abdominal aortic aneurysm and right iliac repair having a complication thereafter of a retroperitoneal bleed for which the patient had significant anemia and required packed red blood cells for improvements. The patient did fairly well with this and has stabilized at this time. The patient does have also paroxysmal nonvalvular atrial fibrillation for which she had some irregular heart beat on admission but currently is spontaneously converted to normal sinus rhythm. The patient previously has been on diltiazem and occasionally use metoprolol for episode. The patient will not be able to use anticoagulation at this time due to retroperitoneal bleed and we have discussed the risks and benefits of medication management and stroke risk with atrial fibrillation. Therefore it is imperative that we have possible heart rate control and management of atrial fibrillation and maintenance of normal rhythm at this time. It is thought that the patient should BE 3 weeks from reinstatement of anticoagulation or other antiplatelet medication management. The patient physiologically is doing well at this time with no chest pain or heart failure or recurrent atrial fibrillation  Past Medical History  Diagnosis Date  . Glaucoma   . Arthritis     right foot andankle   . Atrial fibrillation ([place]) 2013  . Aneurysm ([place])   . Hypertension   . COPD (chronic obstructive pulmonary disease) [facility])       Surgical History:  Past Surgical History  Procedure Laterality Date  .  Foot surgery Right 1950's  . Spinal fusion  2011  . Cataract extraction  2012  . Colonoscopy  2010    ? [place] MD  . Leg surgery Right   . Peripheral vascular catheterization N/A 06/18/2015    Procedure: Abdominal Aortogram w/Lower Extremity;  Surgeon: [name], MD;  Location: [facility];  Service: Cardiovascular;  Laterality: N/A;  . Peripheral vascular catheterization  06/18/2015    Procedure: Lower Extremity Intervention;  Surgeon: [name], MD;  Location: [facility];  Service: Cardiovascular;;  . Peripheral vascular catheterization N/A 06/26/2015    Procedure: Endovascular Repair/Stent Graft;  Surgeon: [name], MD;  Location: [facility];  Service: Cardiovascular;  Laterality: N/A;     Home Meds: Prior to Admission medications   Medication Sig Start Date End Date Taking? Authorizing Provider  diltiazem (DILTIAZEM CD) 180 MG 24 hr capsule Take 180 mg by mouth daily.   Yes Historical Provider, MD  ELIQUIS 5 MG TABS tablet Take 5 mg by mouth 2 (two) times daily.  08/16/13  Yes Historical Provider, MD  folic acid (FOLVITE) 1 MG tablet Take 1 mg by mouth daily.   Yes Historical Provider, MD  latanoprost (XALATAN) 0.005 % ophthalmic solution Apply 1 drop to eye at bedtime.   Yes Historical Provider, MD  lisinopril (PRINIVIL,ZESTRIL) 10 MG tablet Take 10 mg by mouth daily. Reported on 06/26/2015   Yes Historical Provider, MD  lovastatin (MEVACOR) 40 MG tablet Take 40 mg by mouth at bedtime.   Yes Historical Provider, MD  metoprolol tartrate (LOPRESSOR) 25 MG tablet Take 12.5 mg by mouth as needed.  Yes Historical Provider, MD  sildenafil (VIAGRA) 100 MG tablet Take 100 mg by mouth as needed for erectile dysfunction.   Yes Historical Provider, MD  vitamin C (ASCORBIC ACID) 500 MG tablet Take 500 mg by mouth daily.   Yes Historical Provider, MD    Inpatient Medications:  . diltiazem  180 mg Oral Daily  . docusate sodium  100 mg Oral BID  .  latanoprost  1 drop Both Eyes QHS  . pantoprazole (PROTONIX) IV  40 mg Intravenous Q24H      Allergies: No Known Allergies  Social History   Social History  . Marital Status: Married    Spouse Name: N/A  . Number of Children: N/A  . Years of Education: N/A   Occupational History  . Not on file.   Social History Main Topics  . Smoking status: Former Smoker -- 30 years    Quit date: 07/13/1998  . Smokeless tobacco: Never Used  . Alcohol Use: Yes     Comment: very rare  . Drug Use: No  . Sexual Activity: Not on file   Other Topics Concern  . Not on file   Social History Narrative     History reviewed. No pertinent family history.   Review of Systems Positive for right lower quadrant pain Negative for: General:  chills, fever, night sweats or weight changes.  Cardiovascular: PND orthopnea syncope dizziness  Dermatological skin lesions rashes Respiratory: Cough congestion Urologic: Frequent urination urination at night and hematuria Abdominal: negative for nausea, vomiting, diarrhea, bright red blood per rectum, melena, or hematemesis Neurologic: negative for visual changes, and/or hearing changes  All other systems reviewed and are otherwise negative except as noted above.  Labs:  Recent Labs  06/29/15 0154  CKTOTAL 141   Lab Results  Component Value Date   WBC 7.8 06/30/2015   HGB 9.6* 06/30/2015   HCT 28.6* 06/30/2015   MCV 86.8 06/30/2015   PLT 159 06/30/2015    Recent Labs Lab 06/29/15 0154  06/30/15 0211  NA 139  < > 138  K 3.8  < > 3.9  CL 108  < > 111  CO2 24  < > 24  BUN 18  < > 14  CREATININE 0.87  < > 0.82  CALCIUM 8.3*  < > 7.4*  PROT 6.3*  --   --   BILITOT 1.1  --   --   ALKPHOS 87  --   --   ALT 20  --   --   AST 26  --   --   GLUCOSE 148*  < > 97  < > = values in this interval not displayed. No results found for: CHOL, HDL, LDLCALC, TRIG No results found for: DDIMER  Radiology/Studies:  Chest 2 View  06/19/2015  CLINICAL  DATA:  Preop AAA and common iliac artery aneurysm repair EXAM: CHEST  2 VIEW COMPARISON:  12/05/2009 FINDINGS: Lungs are clear.  No pleural effusion or pneumothorax. The heart is normal in size. Degenerative changes of the visualized thoracolumbar spine. IMPRESSION: No evidence of acute cardiopulmonary disease. Electronically Signed   By: Julian Hy M.D.   On: 06/19/2015 08:10   Ct Angio Abdomen W/cm &/or Wo Contrast  06/29/2015  CLINICAL DATA:  Status post abdominal aortic aneurysm stent placement 3 days prior, now with right hip pain. Tender hematoma of right flank. EXAM: CT ANGIOGRAPHY ABDOMEN TECHNIQUE: Multidetector CT imaging of the abdomen was performed using the standard protocol during bolus administration of intravenous  contrast. Multiplanar reconstructed images including MIPs were obtained and reviewed to evaluate the vascular anatomy. CONTRAST:  151mL OMNIPAQUE IOHEXOL 350 MG/ML SOLN COMPARISON:  Preoperative CT 06/03/2015 FINDINGS: Large right retroperitoneal hematoma with heterogeneous stranding and soft tissue density in the right retroperitoneal space. Hematoma tracks laterally about the extraperitoneal space adjacent to the iliacus musculature. There is mild soft tissue stranding extending into the right inguinal canal. No pseudoaneurysm formation. Tiny focus of hyperdensity within the hematoma, series 5, image 162, likely reflects small focus of active bleeding. Aorto bi-iliac stent placement aneurysm sac measures 4.5 cm, unchanged. No extraluminal contrast within the aneurysm sac to suggest endoleak. Small focus of air is likely postsurgical. Bilateral common iliac arteries are patent. Set celiac, superior mesenteric, and inferior mesenteric arteries are patent. Single bilateral renal arteries are patent. Mild atelectasis in the lingula, lung bases otherwise clear. Multiple cysts in the liver. The gallbladder, spleen, pancreas and adrenal glands are unchanged in appearance with small  subcentimeter left adrenal myelolipoma. Small hiatal hernia, stomach is decompressed. No small bowel dilatation. Moderate colonic stool without colonic wall thickening. Distal colonic diverticulosis without diverticulitis. Bladder is mildly distended. Prostate gland appears prominent. Trace dependent free fluid in the pelvis. Postsurgical and degenerative change throughout the lumbar spine. Sclerosis involving sacrum, unchanged. Advanced degenerative change of both hips. Atrophy of the right gluteal musculature. Review of the MIP images confirms the above findings. IMPRESSION: 1. Large right retroperitoneal hematoma. Suspect small focus of active bleeding. 2. Post aorta bi-iliac stent placement with unchanged size of the aneurysm sac. No evidence of endoleak. These results were called by telephone at the time of interpretation on 06/29/2015 at 3:42 am to Dr. Hinda Kehr , who verbally acknowledged these results. Electronically Signed   By: Jeb Levering M.D.   On: 06/29/2015 03:44   Dg Hip Unilat  With Pelvis 2-3 Views Right  06/29/2015  CLINICAL DATA:  Lateral right hip pain for 3 days, worse this morning. No trauma. EXAM: DG HIP (WITH OR WITHOUT PELVIS) 2-3V RIGHT COMPARISON:  None. FINDINGS: Prominent degenerative changes in both hips with diffuse narrowing of the acetabular joint space, sclerosis, and hypertrophic changes on both sides of the joint. The fragments suggested at the medial aspect of the right acetabular joint. This could represent hypertrophic change or loose body. No evidence of acute fracture or dislocation in the right hip. No destructive bone lesions. Pelvis appears intact. SI joints and symphysis pubis are not displaced. Postoperative changes in the lower lumbar spine. Aortoiliac stent grafts. Vascular calcifications. IMPRESSION: Prominent degenerative changes in both hips. Possible loose body in the right hip. No acute fracture or dislocation. Electronically Signed   By: Lucienne Capers M.D.   On: 06/29/2015 02:00   Ct Angio Abd/pel W/ And/or W/o  06/03/2015  CLINICAL DATA:  Evaluate known abdominal aortic aneurysm. EXAM: CT ANGIOGRAPHY ABDOMEN AND PELVIS WITH CONTRAST TECHNIQUE: Multidetector CT imaging of the abdomen and pelvis was performed using the standard protocol during bolus administration of intravenous contrast. Multiplanar reconstructed images including MIPs were obtained and reviewed to evaluate the vascular anatomy. CONTRAST:  162mL OMNIPAQUE IOHEXOL 350 MG/ML SOLN COMPARISON:  Aortic ultrasound - 03/11/2011; CTA of the abdomen pelvis - 04/06/2013 FINDINGS: Vascular Findings: Abdominal aorta: Interval enlargement in size infrarenal abdominal aortic aneurysm, now measuring 4.6 x 4.7 x 4.7 cm as measured in greatest oblique short axis axial (image 116, series 4), coronal (coronal image 61, series 8) and sagittal (sagittal image 95, series 9) previously, 3.8 x  4.2 x 4.1 cm. The cranial aspect of the aneurysm originates approximately 5.8 cm caudal to the take-off of the most inferior left renal artery. The aneurysm is again noted to extends to asymmetrically involve the proximal aspect of the left common iliac artery. There is a persistent moderate amount of eccentric mixed calcified and noncalcified slightly irregular thrombus throughout the dominant component of the aneurysm. No abdominal aortic dissection or periaortic stranding. Celiac artery: There is a minimal amount of eccentric mixed calcified and noncalcified atherosclerotic plaque involving the cranial aspect of the origin of the celiac artery, not definitely resulting in a hemodynamically significant stenosis. The left hepatic artery is incidentally noted to arise from the left gastric artery. Otherwise, conventional branching pattern. SMA: There is a minimal amount of eccentric mixed calcified and noncalcified atherosclerotic plaque involving the origin of the SMA, not resulting in hemodynamically significant  stenosis. Conventional branching pattern. The distal tributaries of the SMA are widely patent without discrete intraluminal filling defect to suggest distal embolism. Right Renal artery: Solitary; there is a minimal amount of eccentric mixed calcified atherosclerotic plaque involving the origin the right renal artery, not resulting in hemodynamically significant stenosis. No definitive vessel irregularity to suggest system deep. Left Renal artery: Solitary; widely patent without hemodynamically significant narrowing. No vessel irregularity to suggest FMD. IMA: Diseased at its origin though remains patent. Right-sided pelvic vasculature: As above, there is extension of the infrarenal abdominal aortic aneurysm to involve the proximal approximately 3 cm of the right common iliac artery. There is a moderate amount of eccentric mixed calcified and noncalcified atherosclerotic plaque throughout the right common iliac artery, not resulting in a hemodynamically significant stenosis. The right internal iliac artery is heavily diseased though patent and of normal caliber. There is a moderate amount of eccentric mixed calcified and noncalcified atherosclerotic plaque throughout the right external iliac artery, not definitely resulting in hemodynamically significant stenosis. Left-sided pelvic vasculature: There is a moderate amount of eccentric mixed calcified and noncalcified atherosclerotic plaque throughout the left common and external iliac arteries, not definitely resulting in hemodynamically significant stenosis. The left internal iliac artery is heavily disease though patent and of normal caliber. Review of the MIP images confirms the above findings. -------------------------------------------------------------------------------- Nonvascular Findings: Evaluation of the abdominal organs is largely limited to the arterial phase of enhancement. Normal hepatic contour. Re- demonstrated multiple hepatic cysts with dominant  cyst within the medial segment of the left lobe of the liver measuring 5.2 cm in diameter (image 42, series 4). Additional scattered sub cm hypoattenuating hepatic lesions are too small to adequately characterize though morphologically similar to the 03/2013 examination and favored to represent additional hepatic cysts. Normal appearance of the gallbladder given degree distention. No radiopaque gallstones. No intra extrahepatic biliary duct dilatation. No ascites. There is symmetric enhancement of the bilateral kidneys. No definite renal stones on this postcontrast examination. Scattered subcentimeter hypoattenuating left-sided renal lesions are too small likely characterize though favored to represent renal cysts. No discrete right-sided renal lesions. No urinary obstruction or perinephric stranding. Normal appearance of the right adrenal gland, pancreas and spleen. Unchanged approximately 1 cm fat containing adrenal myelolipoma within the crux of the left adrenal gland (image 56, series 4). Colonic diverticulosis without evidence diverticulitis. Moderate colonic stool burden without evidence of enteric obstruction. Normal appearance of the terminal ileum and appendix. No pneumoperitoneum, pneumatosis or portal venous gas. No bulky retroperitoneal, mesenteric, pelvic or inguinal lymphadenopathy. Borderline enlargement of the prostate gland with mass effect upon the undersurface of the  urinary bladder. Normal appearance of the urinary bladder given degree of distention. No free fluid in the pelvic cul-de-sac. Normal heart size.  No pericardial effusion. No acute or aggressive osseous abnormalities. Post L4 and L5 paraspinal fusion and intervertebral disc space replacement, without evidence of hardware failure loosening. Sclerotic lesion within the sacrum is unchanged since the 03/2013 examination and thus favored to be benign etiology, likely a bone island (representative sagittal image 101, series 9). Mild to  moderate multilevel lumbar spine DDD. Mild-to-moderate bilateral hip degenerative change, left greater than right. Small bilateral mesenteric fat containing inguinal hernias, left greater than right. Tiny mesenteric fat containing periumbilical hernia. IMPRESSION: Vascular Impression: 1. Interval increase in size of infrarenal abdominal aortic aneurysm, now measuring 4.7 cm in diameter, previously, 4.2 cm. The aneurysm is again noted to arise approximately 5.8 cm caudal to the take-off of the most inferior left renal artery and extend to involve the proximal 3 cm of the right common iliac artery. No evidence of abdominal aortic dissection or periaortic stranding. Nonvascular Impression: 1. Colonic diverticulosis without evidence of diverticulitis. Electronically Signed   By: Sandi Mariscal M.D.   On: 06/03/2015 12:22    EKG: Normal sinus rhythm with nonspecific ST changes  Weights: Filed Weights   06/29/15 0118 06/30/15 0600  Weight: 182 lb (82.555 kg) 188 lb 7.9 oz (85.5 kg)     Physical Exam: Blood pressure 116/70, pulse 100, temperature 98.6 F (37 C), temperature source Oral, resp. rate 13, height 5\' 11"  (1.803 m), weight 188 lb 7.9 oz (85.5 kg), SpO2 98 %. Body mass index is 26.3 kg/(m^2). General: Well developed, well nourished, in no acute distress. Head eyes ears nose throat: Normocephalic, atraumatic, sclera non-icteric, no xanthomas, nares are without discharge. No apparent thyromegaly and/or mass  Lungs: Normal respiratory effort.  no wheezes, no rales, no rhonchi.  Heart: RRR with normal S1 S2. no murmur gallop, no rub, PMI is normal size and placement, carotid upstroke normal without bruit, jugular venous pressure is normal Abdomen: Soft, right lower quadrant tenderness, non-distended with normoactive bowel sounds. No hepatomegaly. No rebound/guarding. No obvious abdominal masses. Abdominal aorta is normal size without bruit Extremities: Trace edema. no cyanosis, no clubbing, no ulcers   Peripheral : 2+ bilateral upper extremity pulses, 2+ bilateral femoral pulses, 2+ bilateral dorsal pedal pulse Neuro: Alert and oriented. No facial asymmetry. No focal deficit. Moves all extremities spontaneously. Musculoskeletal: Normal muscle tone without kyphosis Psych:  Responds to questions appropriately with a normal affect.    Assessment: 80 year old male with essential hypertension peripheral vascular disease with retroperitoneal bleed needing to abstain from anticoagulation and antiplatelet medication now maintaining normal sinus rhythm with history of paroxysmal nonvalvular atrial fibrillation  Plan: 1. Reinstatement of diltiazem from outpatient medications at 180 mg for maintenance of normal sinus rhythm 2. Abstain from anticoagulation due to retroperitoneal bleed and major bleed requiring packed red blood cells for minimum of 3 weeks then reinstate is necessary 3. No further cardiac diagnostics due to no evidence of congestive heart failure or evidence of angina 4. Begin rehabilitation without restrictions to this rehabilitation due to no evidence of cardiac symptoms today  Signed, Corey Skains M.D. [facility] 06/30/2015, 10:23 AM

## 2015-06-30 NOTE — Progress Notes (Signed)
[facility] ADULT ICU REPLACEMENT PROTOCOL FOR AM LAB REPLACEMENT ONLY  The patient does apply for the [facility] Adult ICU Electrolyte Replacment Protocol based on the criteria listed below:   1. Is GFR >/= 40 ml/min? Yes.    Patient's GFR today is >60 2. Is urine output >/= 0.5 ml/kg/hr for the last 6 hours? Yes.   Patient's UOP is 1.2 ml/kg/hr 3. Is BUN < 60 mg/dL? Yes.    Patient's BUN today is 14 4. Abnormal electrolyte(s): Mg 1.7  5. Ordered repletion with: per protocol 6. If a panic level lab has been reported, has the CCM MD in charge been notified? No..   Physician:    Ronda Fairly A 06/30/2015 6:38 AM

## 2015-06-30 NOTE — Consult Note (Signed)
[facility] NAME: Jeremy Higgins    MR#:  [id number]  DATE OF BIRTH:  23-Nov-1934  DATE OF CONSULT:  06/30/2015  PRIMARY CARE PHYSICIAN: Marcello Fennel, MD   REQUESTING/REFERRING PHYSICIAN: Dr. Hortencia Pilar  CHIEF COMPLAINT:   Chief Complaint  Patient presents with  . Hip Pain   right hip pain and noted to have a retroperitoneal hematoma. hospitalist services contacted for medical management.  HISTORY OF PRESENT ILLNESS:  Jeremy Higgins  is a 80 y.o. male with a known history of glaucoma, chronic afibrillation, osteoarthritis, hypertension, COPD, recent abdominal aortic aneurysm repair who was admitted to the hospital due to right hip/flank pain and noted to have a retroperitoneal hematoma. Hospitalist services were contacted for medical management. Patient presently complains some slight right flank/hip pain. He denies any chest pain, shortness of breath, nausea, vomiting, fever, chills or any other associated symptoms presently.  PAST MEDICAL HISTORY:   Past Medical History  Diagnosis Date  . Glaucoma   . Arthritis     right foot andankle   . Atrial fibrillation ([place]) 2013  . Aneurysm ([place])   . Hypertension   . COPD (chronic obstructive pulmonary disease) ([place])     PAST SURGICAL HISTOIRY:   Past Surgical History  Procedure Laterality Date  . Foot surgery Right 1950's  . Spinal fusion  2011  . Cataract extraction  2012  . Colonoscopy  2010    ? [place] MD  . Leg surgery Right   . Peripheral vascular catheterization N/A 06/18/2015    Procedure: Abdominal Aortogram w/Lower Extremity;  Surgeon: [name], MD;  Location: [facility];  Service: Cardiovascular;  Laterality: N/A;  . Peripheral vascular catheterization  06/18/2015    Procedure: Lower Extremity Intervention;  Surgeon: [name], MD;  Location: [facility];  Service: Cardiovascular;;  . Peripheral vascular catheterization  N/A 06/26/2015    Procedure: Endovascular Repair/Stent Graft;  Surgeon: [name], MD;  Location: [facility];  Service: Cardiovascular;  Laterality: N/A;    SOCIAL HISTORY:   Social History  Substance Use Topics  . Smoking status: Former Smoker -- 30 years    Quit date: 07/13/1998  . Smokeless tobacco: Never Used  . Alcohol Use: Yes     Comment: very rare    FAMILY HISTORY:   Family History  Problem Relation Age of Onset  . Heart attack Father     DRUG ALLERGIES:  No Known Allergies  REVIEW OF SYSTEMS:   Review of Systems  Constitutional: Negative for fever and weight loss.  HENT: Negative for congestion, nosebleeds and tinnitus.   Eyes: Negative for blurred vision, double vision and redness.  Respiratory: Negative for cough, hemoptysis and shortness of breath.   Cardiovascular: Negative for chest pain, orthopnea, leg swelling and PND.  Gastrointestinal: Negative for nausea, vomiting, abdominal pain, diarrhea and melena.  Genitourinary: Negative for dysuria, urgency and hematuria.  Musculoskeletal: Positive for joint pain (right hip). Negative for falls.  Neurological: Negative for dizziness, tingling, sensory change, focal weakness, seizures, weakness and headaches.  Endo/Heme/Allergies: Negative for polydipsia. Does not bruise/bleed easily.  Psychiatric/Behavioral: Negative for depression and memory loss. The patient is not nervous/anxious.      MEDICATIONS AT HOME:   Prior to Admission medications   Medication Sig Start Date End Date Taking? Authorizing Provider  diltiazem (DILTIAZEM CD) 180 MG 24 hr capsule Take 180 mg by mouth daily.  Yes Historical Provider, MD  ELIQUIS 5 MG TABS tablet Take 5 mg by mouth 2 (two) times daily.  08/16/13  Yes Historical Provider, MD  folic acid (FOLVITE) 1 MG tablet Take 1 mg by mouth daily.   Yes Historical Provider, MD  latanoprost (XALATAN) 0.005 % ophthalmic solution Apply 1 drop to eye at bedtime.   Yes  Historical Provider, MD  lisinopril (PRINIVIL,ZESTRIL) 10 MG tablet Take 10 mg by mouth daily. Reported on 06/26/2015   Yes Historical Provider, MD  lovastatin (MEVACOR) 40 MG tablet Take 40 mg by mouth at bedtime.   Yes Historical Provider, MD  metoprolol tartrate (LOPRESSOR) 25 MG tablet Take 12.5 mg by mouth as needed.   Yes Historical Provider, MD  sildenafil (VIAGRA) 100 MG tablet Take 100 mg by mouth as needed for erectile dysfunction.   Yes Historical Provider, MD  vitamin C (ASCORBIC ACID) 500 MG tablet Take 500 mg by mouth daily.   Yes Historical Provider, MD      VITAL SIGNS:  Blood pressure 124/66, pulse 67, temperature 98.4 F (36.9 C), temperature source Oral, resp. rate 19, height 5\' 11"  (1.803 m), weight 85.5 kg (188 lb 7.9 oz), SpO2 99 %.  PHYSICAL EXAMINATION:  GENERAL:  80 y.o.-year-old patient lying in the bed with no acute distress.  EYES: Pupils equal, round, reactive to light and accommodation. No scleral icterus. Extraocular muscles intact.  HEENT: Head atraumatic, normocephalic. Oropharynx and nasopharynx clear.  NECK:  Supple, no jugular venous distention. No thyroid enlargement, no tenderness.  LUNGS: Normal breath sounds bilaterally, no wheezing, rales, rhonchi . No use of accessory muscles of respiration.  CARDIOVASCULAR: S1, S2, RRR. No murmurs, rubs, gallops, clicks.  ABDOMEN: Soft, nontender, nondistended. Bowel sounds present. No organomegaly or mass.  EXTREMITIES: No pedal edema, cyanosis, or clubbing.  NEUROLOGIC: Cranial nerves II through XII are intact. No focal motor or sensory deficits appreciated bilaterally  PSYCHIATRIC: The patient is alert and oriented x 3. Good affect SKIN: No obvious rash, lesion, or ulcer. Ecchymosis/bruising noted over the right flank/hip area.   LABORATORY PANEL:   CBC  Recent Labs Lab 06/30/15 0211  06/30/15 1004  WBC 7.8  --   --   HGB 8.8*  < > 9.6*  HCT 26.9*  < > 28.6*  PLT 159  --   --   < > = values in this  interval not displayed. ------------------------------------------------------------------------------------------------------------------  Chemistries   Recent Labs Lab 06/29/15 0154  06/30/15 0211  NA 139  < > 138  K 3.8  < > 3.9  CL 108  < > 111  CO2 24  < > 24  GLUCOSE 148*  < > 97  BUN 18  < > 14  CREATININE 0.87  < > 0.82  CALCIUM 8.3*  < > 7.4*  MG  --   < > 1.7  AST 26  --   --   ALT 20  --   --   ALKPHOS 87  --   --   BILITOT 1.1  --   --   < > = values in this interval not displayed. ------------------------------------------------------------------------------------------------------------------  Cardiac Enzymes No results for input(s): TROPONINI in the last 168 hours. ------------------------------------------------------------------------------------------------------------------  RADIOLOGY:  Ct Angio Abdomen W/cm &/or Wo Contrast  06/29/2015  CLINICAL DATA:  Status post abdominal aortic aneurysm stent placement 3 days prior, now with right hip pain. Tender hematoma of right flank. EXAM: CT ANGIOGRAPHY ABDOMEN TECHNIQUE: Multidetector CT imaging of the abdomen was performed  using the standard protocol during bolus administration of intravenous contrast. Multiplanar reconstructed images including MIPs were obtained and reviewed to evaluate the vascular anatomy. CONTRAST:  140mL OMNIPAQUE IOHEXOL 350 MG/ML SOLN COMPARISON:  Preoperative CT 06/03/2015 FINDINGS: Large right retroperitoneal hematoma with heterogeneous stranding and soft tissue density in the right retroperitoneal space. Hematoma tracks laterally about the extraperitoneal space adjacent to the iliacus musculature. There is mild soft tissue stranding extending into the right inguinal canal. No pseudoaneurysm formation. Tiny focus of hyperdensity within the hematoma, series 5, image 162, likely reflects small focus of active bleeding. Aorto bi-iliac stent placement aneurysm sac measures 4.5 cm, unchanged. No  extraluminal contrast within the aneurysm sac to suggest endoleak. Small focus of air is likely postsurgical. Bilateral common iliac arteries are patent. Set celiac, superior mesenteric, and inferior mesenteric arteries are patent. Single bilateral renal arteries are patent. Mild atelectasis in the lingula, lung bases otherwise clear. Multiple cysts in the liver. The gallbladder, spleen, pancreas and adrenal glands are unchanged in appearance with small subcentimeter left adrenal myelolipoma. Small hiatal hernia, stomach is decompressed. No small bowel dilatation. Moderate colonic stool without colonic wall thickening. Distal colonic diverticulosis without diverticulitis. Bladder is mildly distended. Prostate gland appears prominent. Trace dependent free fluid in the pelvis. Postsurgical and degenerative change throughout the lumbar spine. Sclerosis involving sacrum, unchanged. Advanced degenerative change of both hips. Atrophy of the right gluteal musculature. Review of the MIP images confirms the above findings. IMPRESSION: 1. Large right retroperitoneal hematoma. Suspect small focus of active bleeding. 2. Post aorta bi-iliac stent placement with unchanged size of the aneurysm sac. No evidence of endoleak. These results were called by telephone at the time of interpretation on 06/29/2015 at 3:42 am to Dr. Hinda Kehr , who verbally acknowledged these results. Electronically Signed   By: Jeb Levering M.D.   On: 06/29/2015 03:44   Dg Hip Unilat  With Pelvis 2-3 Views Right  06/29/2015  CLINICAL DATA:  Lateral right hip pain for 3 days, worse this morning. No trauma. EXAM: DG HIP (WITH OR WITHOUT PELVIS) 2-3V RIGHT COMPARISON:  None. FINDINGS: Prominent degenerative changes in both hips with diffuse narrowing of the acetabular joint space, sclerosis, and hypertrophic changes on both sides of the joint. The fragments suggested at the medial aspect of the right acetabular joint. This could represent  hypertrophic change or loose body. No evidence of acute fracture or dislocation in the right hip. No destructive bone lesions. Pelvis appears intact. SI joints and symphysis pubis are not displaced. Postoperative changes in the lower lumbar spine. Aortoiliac stent grafts. Vascular calcifications. IMPRESSION: Prominent degenerative changes in both hips. Possible loose body in the right hip. No acute fracture or dislocation. Electronically Signed   By: Lucienne Capers M.D.   On: 06/29/2015 02:00     IMPRESSION AND PLAN:   80 year old male with past medical history of chronic afibrillation, glaucoma, hypertension, hyperlipidemia, who presented to the hospital due to right hip/flank pain and noted to have a retroperitoneal hematoma.  #1 retroperitoneal hematoma-this is status post abdominal aortic aneurysm repair and patient being on Eliquis.  Isac Caddy supportive care with pain control, transfusions as needed and further care as per vascular surgery. -Patient is presently hemodynamically stable and hemoglobin is stable.  #2 history of chronic afibrillation-patient is currently rate controlled. Continue Cardizem. -Eliquis on hold given the retroperitoneal hematoma.   #3 glaucoma-continue latanoprost eyedrops.  #4 BPH-continue Flomax.  #5 GERD-continue Protonix.  Thanks for the consultation will follow along with  you.  All the records are reviewed and case discussed with Consulting provider. Management plans discussed with the patient, family and they are in agreement.  CODE STATUS: Full  TOTAL TIME TAKING CARE OF THIS PATIENT: 45 minutes.    Henreitta Leber M.D on 06/30/2015 at 3:28 PM  Between 7am to 6pm - Pager - [phone]  After 6pm go to [email or web address] - password [email or web address] [facility]  Office  [phone]  CC: Primary care Physician: Marcello Fennel, MD

## 2015-06-30 NOTE — Progress Notes (Signed)
PULMONARY / CRITICAL CARE MEDICINE   Name: Jeremy Higgins MRN: [id number] DOB: 05-08-1935    ADMISSION DATE:  06/29/2015  BRIEF HISTORY: 80 year old male with past medical history of atrial fibrillation on anticoagulation, hypertension, COPD, recent AAA repair, now with retroperitoneal bleed on the right, noted to have right-sided retroperitoneal hematoma with decreasing hemoglobin. Hemoglobin decreased to 7.3 and was transfused 2 units of packed red blood cells, now hemoglobin stabilizing  SUBJECTIVE:  Hemoglobin decreased down to 7.3 overnight, received 2 units of packed red blood cells, hemoglobin currently greater than 9 and his morning. Patient still with vague abdominal pain on the right flank, otherwise doing well   VITAL SIGNS: Temp:  [97.8 F (36.6 C)-98.9 F (37.2 C)] 98.6 F (37 C) (12/18 0700) Pulse Rate:  [86-104] 100 (12/18 0800) Resp:  [12-20] 13 (12/18 0800) BP: (83-128)/(53-80) 116/70 mmHg (12/18 0800) SpO2:  [93 %-100 %] 98 % (12/18 0800) Weight:  [188 lb 7.9 oz (85.5 kg)] 188 lb 7.9 oz (85.5 kg) (12/18 0600) HEMODYNAMICS:   VENTILATOR SETTINGS:   INTAKE / OUTPUT:  Intake/Output Summary (Last 24 hours) at 06/30/15 [id number] Last data filed at 06/30/15 0800  Gross per 24 hour  Intake   3020 ml  Output   2500 ml  Net    520 ml    Review of Systems  Constitutional: Negative for fever, chills, weight loss and malaise/fatigue.  HENT: Negative for hearing loss.   Eyes: Negative for blurred vision.  Respiratory: Negative for cough, hemoptysis, sputum production, shortness of breath and wheezing.   Gastrointestinal: Positive for abdominal pain. Negative for heartburn and nausea.       Mild abdominal pain on the right flank  Genitourinary: Negative for dysuria.  Musculoskeletal: Negative for myalgias.  Neurological: Negative for dizziness and headaches.  Endo/Heme/Allergies: Bruises/bleeds easily.  Psychiatric/Behavioral: The patient is not nervous/anxious.      Physical Exam  Constitutional: He is oriented to person, place, and time and well-developed, well-nourished, and in no distress.  HENT:  Head: Normocephalic and atraumatic.  Right Ear: External ear normal.  Left Ear: External ear normal.  Eyes: EOM are normal. Pupils are equal, round, and reactive to light. Right eye exhibits no discharge. Left eye exhibits no discharge. No scleral icterus.  Neck: Normal range of motion. Neck supple. No tracheal deviation present. No thyromegaly present.  Cardiovascular: Normal rate, regular rhythm, normal heart sounds and intact distal pulses.   Pulmonary/Chest: Effort normal and breath sounds normal. No respiratory distress. He has no wheezes. He has no rales. He exhibits no tenderness.  Abdominal: Soft. Bowel sounds are normal. There is tenderness. There is no rebound and no guarding.  Musculoskeletal: Normal range of motion. He exhibits edema.  +1 pitting edema  Neurological: He is alert and oriented to person, place, and time.  Skin: Skin is warm and dry.  Nursing note and vitals reviewed.    LABS:  CBC  Recent Labs Lab 06/29/15 0154 06/29/15 0558  06/29/15 1825 06/30/15 0211 06/30/15 0622  WBC 10.3 13.1*  --   --  7.8  --   HGB 10.7* 9.1*  < > 7.4* 8.8* 9.2*  HCT 32.4* 28.1*  < > 22.7* 26.9* 27.6*  PLT 193 190  --   --  159  --   < > = values in this interval not displayed. Coag's  Recent Labs Lab 06/29/15 0154 06/30/15 0211  APTT 42* 38*  INR 1.46 1.23   BMET  Recent Labs Lab 06/29/15 0154  06/29/15 0558 06/30/15 0211  NA 139 138 138  K 3.8 4.1 3.9  CL 108 109 111  CO2 24 24 24   BUN 18 17 14   CREATININE 0.87 0.82 0.82  GLUCOSE 148* 138* 97   Electrolytes  Recent Labs Lab 06/29/15 0154 06/29/15 0558 06/30/15 0211  CALCIUM 8.3* 7.9* 7.4*  MG  --  1.9 1.7   Sepsis Markers No results for input(s): LATICACIDVEN, PROCALCITON, O2SATVEN in the last 168 hours. ABG No results for input(s): PHART, PCO2ART,  PO2ART in the last 168 hours. Liver Enzymes  Recent Labs Lab 06/29/15 0154  AST 26  ALT 20  ALKPHOS 87  BILITOT 1.1  ALBUMIN 3.3*   Cardiac Enzymes No results for input(s): TROPONINI, PROBNP in the last 168 hours. Glucose  Recent Labs Lab 06/26/15 1114 06/29/15 0553 06/29/15 1617  GLUCAP 93 147* 102*    Imaging No results found.  LINES:   CULTURES:   ANTIBIOTICS  ASSESSMENT / PLAN:  80 year old male past medical history of coronary artery disease, atrial fibrillation, mild COPD, recent AAA repair, now with large right-sided retroperitoneal hematoma, he medically stable.  Retroperitoneal hematoma -Seen and evaluated by vascular surgery, per their recommendations this is most likely spontaneously in combination with being on anticoagulation for atrial fibrillation. Anticoagulation has been stopped, he was given reversal agent. Currently has no indication for surgical intervention since patient is medically stable and his hemoglobin is stabilized. - Hb 7.3> transfused 2u PRBC overnight, now stable.  -Continue with serial hemoglobin checks -Maintain hemoglobin greater than 7. Anticipate drop in Hb, then stabilization.  -Monitor hemodynamics -Patient currently medically stable -Pain control  Atrial fibrillation-previously on anticoagulation now stopped due to large retroperitoneal hematoma -Currently rate controlled -Continue at antihypertensive medications for systolics greater than 123456, this also rate control his atrial fibrillation -Once the retroperitoneal hematoma has resolved, may need to restart anticoagulation in the near future  Hypertension -Resume oral antihypertensive medications for systolics greater than 123456 -Continue with hemodynamic monitoring  COPD-mild -Review of his outpatient chart shows that he's not currently requiring any bronchodilators -Albuterol as needed -Stable and mild COPD not requiring ICS/LABA  Patient is hemodynamically  stable, not requiring ICU status, we will place in step down status/medical floor status. Further medical management deferred to internal medicine. Thank you for consulting [facility]  Thank you for consulting [facility], we will signoff at this time. Please feel free to contact us with any questions at [phone] (please enter 7-digits).  I have personally obtained a history, examined the patient, evaluated laboratory and imaging results, formulated the assessment and plan and placed orders.  Critical Care/Pulmonary consult time - 35 mins  Vilinda Boehringer, MD [facility] Pager [phone] (please enter 7-digits) On Call Pager - [phone] (please enter 7-digits)  Note: This note was prepared with Dragon dictation along with smaller phrase technology. Any transcriptional errors that result from this process are unintentional.

## 2015-06-30 NOTE — Progress Notes (Signed)
[facility]  Daily Progress Note   Subjective: Patient transfused two units PRBC's last night for Hbg of 7.5 - appropriate respose this AM now 9.6. Patient complaining of urinary retention - was straight cath'd twice last night (states does not have this problem at home) and constipation. Patient also asking to advance diet as he is hungry - otherwise without complaint. Cards consulted for afib and anticoagulation recommendations.   Objective: Filed Vitals:   06/30/15 0500 06/30/15 0600 06/30/15 0700 06/30/15 0800  BP: 107/58 103/59 112/62 116/70  Pulse: 103 102 97 100  Temp:  98.6 F (37 C) 98.6 F (37 C)   TempSrc:  Oral Oral   Resp: 17 15 17 13   Height:      Weight:  85.5 kg (188 lb 7.9 oz)    SpO2: 93% 95% 99% 98%    Intake/Output Summary (Last 24 hours) at 06/30/15 1101 Last data filed at 06/30/15 0800  Gross per 24 hour  Intake   2820 ml  Output   2500 ml  Net    320 ml   Physical Exam: A&Ox3, NAD CV: Irregular Pulmonary: CTA Bilaterally Abdomen: Soft, Nontender, Nondistended Groin: Bilateral: No swelling / drainage Vascular: Left Lower Extremity: warm, non-tender with palpable pedal pulses Right Lower Extremity: warm, non-tender with palpable pedal pulses   Laboratory: CBC    Component Value Date/Time   WBC 7.8 06/30/2015 0211   HGB 9.6* 06/30/2015 1004   HCT 28.6* 06/30/2015 1004   PLT 159 06/30/2015 0211   BMET    Component Value Date/Time   NA 138 06/30/2015 0211   K 3.9 06/30/2015 0211   CL 111 06/30/2015 0211   CO2 24 06/30/2015 0211   GLUCOSE 97 06/30/2015 0211   BUN 14 06/30/2015 0211   CREATININE 0.82 06/30/2015 0211   CALCIUM 7.4* 06/30/2015 0211   GFRNONAA >60 06/30/2015 0211   GFRAA >60 06/30/2015 0211   Assessment/Planning: 80 year old male admitted last night probable spontaneous retroperitoneal hemorrhage 1) Anti-coagulation held. Serial H&H - Patient transfused two units  PRBC's last night for Hbg of 7.5 - appropriate respose this AM now 9.6. 2) DVT prophylaxis patient is on SCDs and TED hose no anticoagulation given his bleeding 3) Continue best rest until Monday or Hbg stable. 4) GI proph - Protonix 5) Bowel Regimen for constipation 6) Pain Control 7) if patient experiences urinary retention again would place foley and start flomax 8) Discussed with Dr. Eber Hong Leiya Keesey PA-C 06/30/2015 11:01 AM

## 2015-06-30 NOTE — H&P (Signed)
[facility] Admission History & Physical  MRN : [id number]  Jeremy Higgins is a 80 y.o. ([birth year]) male who presents with chief complaint of No chief complaint on file. Marland Kitchen  History of Present Illness: Patient presents for repair of his abdominal aortic aneurysm with bilateral iliac artery aneurysms. Although his abdominal aortic aneurysm is slightly less than 5 cm his left common iliac aneurysm is so large it exceeds the diameter of his aorta and therefore he requires repair to prevent lethal rupture of the aortoiliac aneurysmal disease. He denies back or abdominal pain. No fever chills. No recent illnesses. He is status post coil embolization of the right internal iliac artery which was in preparation for this repair  No current facility-administered medications for this encounter.   No current outpatient prescriptions on file.   Facility-Administered Medications Ordered in Other Encounters  Medication Dose Route Frequency Provider Last Rate Last Dose  . acetaminophen (TYLENOL) tablet 650 mg  650 mg Oral Q6H PRN Janalyn Harder Stegmayer, PA-C       Or  . acetaminophen (TYLENOL) suppository 650 mg  650 mg Rectal Q6H PRN Kimberly A Stegmayer, PA-C      . alum & mag hydroxide-simeth (MAALOX/MYLANTA) 200-200-20 MG/5ML suspension 15-30 mL  15-30 mL Oral Q2H PRN [name], MD      . diltiazem (CARDIZEM CD) 24 hr capsule 180 mg  180 mg Oral Daily Corey Skains, MD   180 mg at 06/30/15 1224  . docusate sodium (COLACE) capsule 100 mg  100 mg Oral BID Janalyn Harder Stegmayer, PA-C   100 mg at 06/30/15 [id number]  . latanoprost (XALATAN) 0.005 % ophthalmic solution 1 drop  1 drop Both Eyes QHS Anders Simmonds, MD   1 drop at 06/29/15 2315  . magnesium hydroxide (MILK OF MAGNESIA) suspension 30 mL  30 mL Oral Daily PRN Kimberly A Stegmayer, PA-C      . magnesium sulfate IVPB 2 g 50 mL  2 g Intravenous Daily PRN [name], MD      . morphine 2 MG/ML injection 2 mg  2 mg  Intravenous Q4H PRN Kimberly A Stegmayer, PA-C      . ondansetron (ZOFRAN) tablet 4 mg  4 mg Oral Q6H PRN Kimberly A Stegmayer, PA-C       Or  . ondansetron (ZOFRAN) injection 4 mg  4 mg Intravenous Q6H PRN Kimberly A Stegmayer, PA-C      . oxyCODONE-acetaminophen (PERCOCET/ROXICET) 5-325 MG per tablet 1 tablet  1 tablet Oral Q4H PRN Sela Hua, PA-C   1 tablet at 06/29/15 1748  . oxyCODONE-acetaminophen (PERCOCET/ROXICET) 5-325 MG per tablet 2 tablet  2 tablet Oral Q4H PRN Janalyn Harder Stegmayer, PA-C      . pantoprazole (PROTONIX) injection 40 mg  40 mg Intravenous Q24H Kimberly A Stegmayer, PA-C   40 mg at 06/30/15 0608  . potassium chloride SA (K-DUR,KLOR-CON) CR tablet 20-40 mEq  20-40 mEq Oral Daily PRN [name], MD      . tamsulosin [facility]) capsule 0.4 mg  0.4 mg Oral QPC supper Sela Hua, PA-C        Past Medical History  Diagnosis Date  . Glaucoma   . Arthritis     right foot andankle   . Atrial fibrillation ([place]) 2013  . Aneurysm ([employer])   . Hypertension   . COPD (chronic obstructive pulmonary disease) [facility])     Past Surgical History  Procedure Laterality Date  .  Foot surgery Right 1950's  . Spinal fusion  2011  . Cataract extraction  2012  . Colonoscopy  2010    ? Rattan MD  . Leg surgery Right   . Peripheral vascular catheterization N/A 06/18/2015    Procedure: Abdominal Aortogram w/Lower Extremity;  Surgeon: [name], MD;  Location: [place] [facility];  Service: Cardiovascular;  Laterality: N/A;  . Peripheral vascular catheterization  06/18/2015    Procedure: Lower Extremity Intervention;  Surgeon: [name], MD;  Location: [place] [facility];  Service: Cardiovascular;;  . Peripheral vascular catheterization N/A 06/26/2015    Procedure: Endovascular Repair/Stent Graft;  Surgeon: [name], MD;  Location: [place] [facility];  Service: Cardiovascular;  Laterality: N/A;    Social History Social History  Substance  Use Topics  . Smoking status: Former Smoker -- 59 years    Quit date: 07/13/1998  . Smokeless tobacco: Never Used  . Alcohol Use: Yes     Comment: very rare    Family History History reviewed. No pertinent family history. no family history of porphyria, autoimmune disease or bleeding clotting disorders  No Known Allergies   REVIEW OF SYSTEMS (Negative unless checked)  Constitutional: [] Weight loss  [] Fever  [] Chills Cardiac: [] Chest pain   [] Chest pressure   [] Palpitations   [] Shortness of breath when laying flat   [] Shortness of breath at rest   [] Shortness of breath with exertion. Vascular:  [] Pain in legs with walking   [] Pain in legs at rest   [] Pain in legs when laying flat   [] Claudication   [] Pain in feet when walking  [] Pain in feet at rest  [] Pain in feet when laying flat   [] History of DVT   [] Phlebitis   [] Swelling in legs   [] Varicose veins   [] Non-healing ulcers Pulmonary:   [] Uses home oxygen   [] Productive cough   [] Hemoptysis   [] Wheeze  [] COPD   [] Asthma Neurologic:  [] Dizziness  [] Blackouts   [] Seizures   [] History of stroke   [] History of TIA  [] Aphasia   [] Temporary blindness   [] Dysphagia   [] Weakness or numbness in arms   [] Weakness or numbness in legs Musculoskeletal:  [] Arthritis   [] Joint swelling   [] Joint pain   [] Low back pain Hematologic:  [] Easy bruising  [] Easy bleeding   [] Hypercoagulable state   [] Anemic  [] Hepatitis Gastrointestinal:  [] Blood in stool   [] Vomiting blood  [] Gastroesophageal reflux/heartburn   [] Difficulty swallowing. Genitourinary:  [] Chronic kidney disease   [] Difficult urination  [] Frequent urination  [] Burning with urination   [] Blood in urine Skin:  [] Rashes   [] Ulcers   [] Wounds Psychological:  [] History of anxiety   []  History of major depression.  Physical Examination  Filed Vitals:   06/27/15 1059 06/27/15 1100 06/27/15 1200 06/27/15 1300  BP: 108/59 128/66 120/59 120/64  Pulse:  99 101 107  Temp:   98.1 F (36.7 C)    TempSrc:   Oral   Resp:  21 18 13   Height:      Weight:      SpO2:  94% 93% 94%   Body mass index is 25.44 kg/(m^2). Gen: WD/WN, NAD Head: [place]/AT, No temporalis wasting.  Ear/Nose/Throat: Hearing grossly intact, nares w/o erythema or drainage, oropharynx w/o Erythema/Exudate, Eyes: PERRLA, EOMI.  Neck: Supple, no nuchal rigidity.  No bruit or JVD.  Pulmonary:  Good air movement, clear to auscultation bilaterally, no increased work of respiration or use of accessory muscles  Cardiac: Irregularly irregular, normal  S1, S2, no Murmurs, rubs or gallops. Vascular: Left groin intact small hematoma noted status post anterior 4 coil embolization Vessel Right Left  Radial Palpable Palpable  Ulnar Palpable Palpable  Brachial Palpable Palpable  Carotid Palpable, without bruit Palpable, without bruit  Aorta Not palpable N/A  Femoral Palpable Palpable  Popliteal Palpable Palpable  PT Palpable Palpable  DP Palpable Palpable   Gastrointestinal: soft, non-tender/non-distended. No guarding/reflex. No masses, surgical incisions, or scars. Musculoskeletal: M/S 5/5 throughout.  No deformity or atrophy. Neurologic: CN 2-12 intact. Pain and light touch intact in extremities.  Symmetrical.  Speech is fluent. Motor exam as listed above. Psychiatric: Judgment intact, Mood & affect appropriate for pt's clinical situation. Dermatologic: No rashes or ulcers noted.  No cellulitis or open wounds. Lymph : No Cervical, Axillary, or Inguinal lymphadenopathy.   CBC Lab Results  Component Value Date   WBC 7.8 06/30/2015   HGB 9.6* 06/30/2015   HCT 28.6* 06/30/2015   MCV 86.8 06/30/2015   PLT 159 06/30/2015    BMET    Component Value Date/Time   NA 138 06/30/2015 0211   K 3.9 06/30/2015 0211   CL 111 06/30/2015 0211   CO2 24 06/30/2015 0211   GLUCOSE 97 06/30/2015 0211   BUN 14 06/30/2015 0211   CREATININE 0.82 06/30/2015 0211   CALCIUM 7.4* 06/30/2015 0211   GFRNONAA >60 06/30/2015 0211   GFRAA  >60 06/30/2015 0211   Estimated Creatinine Clearance: 79.1 mL/min (by C-G formula based on Cr of 0.82).  COAG Lab Results  Component Value Date   INR 1.23 06/30/2015   INR 1.46 06/29/2015   INR 1.08 06/18/2015    Radiology Chest 2 View  06/19/2015  CLINICAL DATA:  Preop AAA and common iliac artery aneurysm repair EXAM: CHEST  2 VIEW COMPARISON:  12/05/2009 FINDINGS: Lungs are clear.  No pleural effusion or pneumothorax. The heart is normal in size. Degenerative changes of the visualized thoracolumbar spine. IMPRESSION: No evidence of acute cardiopulmonary disease. Electronically Signed   By: Julian Hy M.D.   On: 06/19/2015 08:10   Ct Angio Abdomen W/cm &/or Wo Contrast  06/29/2015  CLINICAL DATA:  Status post abdominal aortic aneurysm stent placement 3 days prior, now with right hip pain. Tender hematoma of right flank. EXAM: CT ANGIOGRAPHY ABDOMEN TECHNIQUE: Multidetector CT imaging of the abdomen was performed using the standard protocol during bolus administration of intravenous contrast. Multiplanar reconstructed images including MIPs were obtained and reviewed to evaluate the vascular anatomy. CONTRAST:  182mL OMNIPAQUE IOHEXOL 350 MG/ML SOLN COMPARISON:  Preoperative CT 06/03/2015 FINDINGS: Large right retroperitoneal hematoma with heterogeneous stranding and soft tissue density in the right retroperitoneal space. Hematoma tracks laterally about the extraperitoneal space adjacent to the iliacus musculature. There is mild soft tissue stranding extending into the right inguinal canal. No pseudoaneurysm formation. Tiny focus of hyperdensity within the hematoma, series 5, image 162, likely reflects small focus of active bleeding. Aorto bi-iliac stent placement aneurysm sac measures 4.5 cm, unchanged. No extraluminal contrast within the aneurysm sac to suggest endoleak. Small focus of air is likely postsurgical. Bilateral common iliac arteries are patent. Set celiac, superior mesenteric,  and inferior mesenteric arteries are patent. Single bilateral renal arteries are patent. Mild atelectasis in the lingula, lung bases otherwise clear. Multiple cysts in the liver. The gallbladder, spleen, pancreas and adrenal glands are unchanged in appearance with small subcentimeter left adrenal myelolipoma. Small hiatal hernia, stomach is decompressed. No small bowel dilatation. Moderate colonic stool without colonic wall thickening.  Distal colonic diverticulosis without diverticulitis. Bladder is mildly distended. Prostate gland appears prominent. Trace dependent free fluid in the pelvis. Postsurgical and degenerative change throughout the lumbar spine. Sclerosis involving sacrum, unchanged. Advanced degenerative change of both hips. Atrophy of the right gluteal musculature. Review of the MIP images confirms the above findings. IMPRESSION: 1. Large right retroperitoneal hematoma. Suspect small focus of active bleeding. 2. Post aorta bi-iliac stent placement with unchanged size of the aneurysm sac. No evidence of endoleak. These results were called by telephone at the time of interpretation on 06/29/2015 at 3:42 am to Dr. Hinda Kehr , who verbally acknowledged these results. Electronically Signed   By: Jeb Levering M.D.   On: 06/29/2015 03:44   Dg Hip Unilat  With Pelvis 2-3 Views Right  06/29/2015  CLINICAL DATA:  Lateral right hip pain for 3 days, worse this morning. No trauma. EXAM: DG HIP (WITH OR WITHOUT PELVIS) 2-3V RIGHT COMPARISON:  None. FINDINGS: Prominent degenerative changes in both hips with diffuse narrowing of the acetabular joint space, sclerosis, and hypertrophic changes on both sides of the joint. The fragments suggested at the medial aspect of the right acetabular joint. This could represent hypertrophic change or loose body. No evidence of acute fracture or dislocation in the right hip. No destructive bone lesions. Pelvis appears intact. SI joints and symphysis pubis are not  displaced. Postoperative changes in the lower lumbar spine. Aortoiliac stent grafts. Vascular calcifications. IMPRESSION: Prominent degenerative changes in both hips. Possible loose body in the right hip. No acute fracture or dislocation. Electronically Signed   By: Lucienne Capers M.D.   On: 06/29/2015 02:00   Ct Angio Abd/pel W/ And/or W/o  06/03/2015  CLINICAL DATA:  Evaluate known abdominal aortic aneurysm. EXAM: CT ANGIOGRAPHY ABDOMEN AND PELVIS WITH CONTRAST TECHNIQUE: Multidetector CT imaging of the abdomen and pelvis was performed using the standard protocol during bolus administration of intravenous contrast. Multiplanar reconstructed images including MIPs were obtained and reviewed to evaluate the vascular anatomy. CONTRAST:  119mL OMNIPAQUE IOHEXOL 350 MG/ML SOLN COMPARISON:  Aortic ultrasound - 03/11/2011; CTA of the abdomen pelvis - 04/06/2013 FINDINGS: Vascular Findings: Abdominal aorta: Interval enlargement in size infrarenal abdominal aortic aneurysm, now measuring 4.6 x 4.7 x 4.7 cm as measured in greatest oblique short axis axial (image 116, series 4), coronal (coronal image 61, series 8) and sagittal (sagittal image 95, series 9) previously, 3.8 x 4.2 x 4.1 cm. The cranial aspect of the aneurysm originates approximately 5.8 cm caudal to the take-off of the most inferior left renal artery. The aneurysm is again noted to extends to asymmetrically involve the proximal aspect of the left common iliac artery. There is a persistent moderate amount of eccentric mixed calcified and noncalcified slightly irregular thrombus throughout the dominant component of the aneurysm. No abdominal aortic dissection or periaortic stranding. Celiac artery: There is a minimal amount of eccentric mixed calcified and noncalcified atherosclerotic plaque involving the cranial aspect of the origin of the celiac artery, not definitely resulting in a hemodynamically significant stenosis. The left hepatic artery is  incidentally noted to arise from the left gastric artery. Otherwise, conventional branching pattern. SMA: There is a minimal amount of eccentric mixed calcified and noncalcified atherosclerotic plaque involving the origin of the SMA, not resulting in hemodynamically significant stenosis. Conventional branching pattern. The distal tributaries of the SMA are widely patent without discrete intraluminal filling defect to suggest distal embolism. Right Renal artery: Solitary; there is a minimal amount of eccentric mixed calcified atherosclerotic plaque  involving the origin the right renal artery, not resulting in hemodynamically significant stenosis. No definitive vessel irregularity to suggest system deep. Left Renal artery: Solitary; widely patent without hemodynamically significant narrowing. No vessel irregularity to suggest FMD. IMA: Diseased at its origin though remains patent. Right-sided pelvic vasculature: As above, there is extension of the infrarenal abdominal aortic aneurysm to involve the proximal approximately 3 cm of the right common iliac artery. There is a moderate amount of eccentric mixed calcified and noncalcified atherosclerotic plaque throughout the right common iliac artery, not resulting in a hemodynamically significant stenosis. The right internal iliac artery is heavily diseased though patent and of normal caliber. There is a moderate amount of eccentric mixed calcified and noncalcified atherosclerotic plaque throughout the right external iliac artery, not definitely resulting in hemodynamically significant stenosis. Left-sided pelvic vasculature: There is a moderate amount of eccentric mixed calcified and noncalcified atherosclerotic plaque throughout the left common and external iliac arteries, not definitely resulting in hemodynamically significant stenosis. The left internal iliac artery is heavily disease though patent and of normal caliber. Review of the MIP images confirms the above  findings. -------------------------------------------------------------------------------- Nonvascular Findings: Evaluation of the abdominal organs is largely limited to the arterial phase of enhancement. Normal hepatic contour. Re- demonstrated multiple hepatic cysts with dominant cyst within the medial segment of the left lobe of the liver measuring 5.2 cm in diameter (image 42, series 4). Additional scattered sub cm hypoattenuating hepatic lesions are too small to adequately characterize though morphologically similar to the 03/2013 examination and favored to represent additional hepatic cysts. Normal appearance of the gallbladder given degree distention. No radiopaque gallstones. No intra extrahepatic biliary duct dilatation. No ascites. There is symmetric enhancement of the bilateral kidneys. No definite renal stones on this postcontrast examination. Scattered subcentimeter hypoattenuating left-sided renal lesions are too small likely characterize though favored to represent renal cysts. No discrete right-sided renal lesions. No urinary obstruction or perinephric stranding. Normal appearance of the right adrenal gland, pancreas and spleen. Unchanged approximately 1 cm fat containing adrenal myelolipoma within the crux of the left adrenal gland (image 56, series 4). Colonic diverticulosis without evidence diverticulitis. Moderate colonic stool burden without evidence of enteric obstruction. Normal appearance of the terminal ileum and appendix. No pneumoperitoneum, pneumatosis or portal venous gas. No bulky retroperitoneal, mesenteric, pelvic or inguinal lymphadenopathy. Borderline enlargement of the prostate gland with mass effect upon the undersurface of the urinary bladder. Normal appearance of the urinary bladder given degree of distention. No free fluid in the pelvic cul-de-sac. Normal heart size.  No pericardial effusion. No acute or aggressive osseous abnormalities. Post L4 and L5 paraspinal fusion and  intervertebral disc space replacement, without evidence of hardware failure loosening. Sclerotic lesion within the sacrum is unchanged since the 03/2013 examination and thus favored to be benign etiology, likely a bone island (representative sagittal image 101, series 9). Mild to moderate multilevel lumbar spine DDD. Mild-to-moderate bilateral hip degenerative change, left greater than right. Small bilateral mesenteric fat containing inguinal hernias, left greater than right. Tiny mesenteric fat containing periumbilical hernia. IMPRESSION: Vascular Impression: 1. Interval increase in size of infrarenal abdominal aortic aneurysm, now measuring 4.7 cm in diameter, previously, 4.2 cm. The aneurysm is again noted to arise approximately 5.8 cm caudal to the take-off of the most inferior left renal artery and extend to involve the proximal 3 cm of the right common iliac artery. No evidence of abdominal aortic dissection or periaortic stranding. Nonvascular Impression: 1. Colonic diverticulosis without evidence of diverticulitis. Electronically Signed  By: Sandi Mariscal M.D.   On: 06/03/2015 12:22    Assessment/Plan 1.  Abdominal aortic aneurysm:  Patient has undergone some successful right internal iliac coil embolization. He will now move forward with repair of his aortoiliac aneurysmal disease with endovascular techniques. The risks and benefits were reviewed all questions are answered patient has agreed to proceed. 2.  Atrial fibrillation:  His Eliquis was has been discontinued 36 hours prior to the surgery. He will be restarted approximately 24 hours after successful repair. 3.  Hypertension:  His antihypertensive medications will be continued postoperatively. 4.  COPD:  Aerosol treatments will be continued postoperatively.   Berlie Hatchel, Dolores Lory, MD  06/30/2015 12:35 PM

## 2015-07-01 LAB — HEMOGLOBIN AND HEMATOCRIT, BLOOD
HEMATOCRIT: 31.4 % — AB (ref 40.0–52.0)
HEMOGLOBIN: 10.4 g/dL — AB (ref 13.0–18.0)

## 2015-07-01 LAB — CBC
HCT: 28.2 % — ABNORMAL LOW (ref 40.0–52.0)
Hemoglobin: 9.1 g/dL — ABNORMAL LOW (ref 13.0–18.0)
MCH: 28.3 pg (ref 26.0–34.0)
MCHC: 32.4 g/dL (ref 32.0–36.0)
MCV: 87.2 fL (ref 80.0–100.0)
Platelets: 197 10*3/uL (ref 150–440)
RBC: 3.24 MIL/uL — ABNORMAL LOW (ref 4.40–5.90)
RDW: 14.4 % (ref 11.5–14.5)
WBC: 8.1 10*3/uL (ref 3.8–10.6)

## 2015-07-01 LAB — BASIC METABOLIC PANEL
Anion gap: 4 — ABNORMAL LOW (ref 5–15)
BUN: 17 mg/dL (ref 6–20)
CALCIUM: 7.8 mg/dL — AB (ref 8.9–10.3)
CO2: 26 mmol/L (ref 22–32)
CREATININE: 0.87 mg/dL (ref 0.61–1.24)
Chloride: 110 mmol/L (ref 101–111)
Glucose, Bld: 106 mg/dL — ABNORMAL HIGH (ref 65–99)
Potassium: 3.9 mmol/L (ref 3.5–5.1)
SODIUM: 140 mmol/L (ref 135–145)

## 2015-07-01 LAB — MAGNESIUM: Magnesium: 2.3 mg/dL (ref 1.7–2.4)

## 2015-07-01 MED ORDER — PANTOPRAZOLE SODIUM 40 MG PO TBEC
40.0000 mg | DELAYED_RELEASE_TABLET | Freq: Every day | ORAL | Status: DC
  Administered 2015-07-02 – 2015-07-03 (×2): 40 mg via ORAL
  Filled 2015-07-01 (×2): qty 1

## 2015-07-01 NOTE — Progress Notes (Signed)
[facility]  Daily Progress Note   Subjective: Patient transfused two units PRBC's Hgb this am 10.4. Up in chair today feels good  Objective: Filed Vitals:   07/01/15 1000 07/01/15 1100 07/01/15 1200 07/01/15 1405  BP: 126/62 132/63 122/58 114/55  Pulse: 82 78 74 73  Temp:    98.2 F (36.8 C)  TempSrc:    Oral  Resp: 16 18 16 19   Height:      Weight:      SpO2: 98% 98% 98% 96%    Intake/Output Summary (Last 24 hours) at 07/01/15 1851 Last data filed at 07/01/15 1300  Gross per 24 hour  Intake    700 ml  Output   1591 ml  Net   -891 ml   Physical Exam: A&Ox3, NAD CV: Irregular Pulmonary: CTA Bilaterally Abdomen: Soft, Nontender, Nondistended Groin: Bilateral: No swelling / drainage Vascular: Left Lower Extremity: warm, non-tender with palpable pedal pulses Right Lower Extremity: warm, non-tender with palpable pedal pulses   Laboratory: CBC    Component Value Date/Time   WBC 8.1 07/01/2015 0456   HGB 10.4* 07/01/2015 1154   HCT 31.4* 07/01/2015 1154   PLT 197 07/01/2015 0456   BMET    Component Value Date/Time   NA 140 07/01/2015 0456   K 3.9 07/01/2015 0456   CL 110 07/01/2015 0456   CO2 26 07/01/2015 0456   GLUCOSE 106* 07/01/2015 0456   BUN 17 07/01/2015 0456   CREATININE 0.87 07/01/2015 0456   CALCIUM 7.8* 07/01/2015 0456   GFRNONAA >60 07/01/2015 0456   GFRAA >60 07/01/2015 0456   Assessment/Planning: 80 year old male admitted last night probable spontaneous retroperitoneal hemorrhage 1) Anti-coagulation held. Serial H&H - Patient transfused two units PRBC's last night for Hbg of 7.5 - appropriate respose this AM now 9.6. 2) DVT prophylaxis patient is on SCDs and TED hose no anticoagulation given his bleeding 3) Up to chair today and will ambulate tomorrow 4) GI proph - Protonix 5) Bowel Regimen for constipation 6) Pain Control  Hortencia Pilar, MD 07/01/2015 6:51 PM

## 2015-07-01 NOTE — Progress Notes (Signed)
Mr. Wiles is alert and oriented. Mood pleasant and cooperative. No c/o pain. Last hemoglobin of 10.4. One large bowel movement. No signs of active bleeding. VSS. NSR. Transfered to room 103. Report given to nurse.

## 2015-07-01 NOTE — Clinical Documentation Improvement (Signed)
Critical Care  Can the diagnosis of anemia be further specified?   Acute Blood Loss Anemia  Nutritional Anemia  Chronic Anemia, including the suspected or known cause  Anemia of chronic disease, including the associated chronic disease state  Other  Clinically Undetermined  Supporting Information: 06/27/15: H/H= 11.2/34.2 06/29/15: H/H= 10.7/32.4, 9.1/28.1, 8.4/26.4, 8.1/25, 7.4/22.7 Post-Transfusion: 06/30/15: H/H= 8.8/26.9 07/01/15: H/H= 9.1/28.2   Please exercise your independent, professional judgment when responding. A specific answer is not anticipated or expected.   Thank You,  Rolm Gala, RN, [place] [phone]

## 2015-07-01 NOTE — Progress Notes (Signed)
[facility] Cardiology [facility] Encounter Note  Patient: Jeremy Higgins / Admit Date: 06/29/2015 / Date of Encounter: 07/01/2015, 10:45 AM   Subjective: No cardiac sx hemodynamically stable   Review of Systems: Positive for:leg pain Negative for: Vision change, hearing change, syncope, dizziness, nausea, vomiting,diarrhea, bloody stool, stomach pain, cough, congestion, diaphoresis, urinary frequency, urinary pain,skin lesions, skin rashes Others previously listed  Objective: Telemetry: nsr Physical Exam: Blood pressure 109/66, pulse 80, temperature 98.4 F (36.9 C), temperature source Oral, resp. rate 21, height 5\' 11"  (1.803 m), weight 188 lb 0.8 oz (85.3 kg), SpO2 95 %. Body mass index is 26.24 kg/(m^2). General: Well developed, well nourished, in no acute distress. Head: Normocephalic, atraumatic, sclera non-icteric, no xanthomas, nares are without discharge. Neck: No apparent masses Lungs: Normal respirations with no wheezes, no rhonchi, no rales , no crackles   Heart: Regular rate and rhythm, normal S1 S2, no murmur, no rub, no gallop, PMI is normal size and placement, carotid upstroke normal without bruit, jugular venous pressure normal Abdomen: Soft, tender, non-distended with normoactive bowel sounds. No hepatosplenomegaly. Abdominal aorta is normal size with  bruit Extremities: No edema, no clubbing, no cyanosis, no ulcers,  Peripheral: 2+ radial, 2+ femoral with right tenderness, 2+ dorsal pedal pulses Neuro: Alert and oriented. Moves all extremities spontaneously. Psych:  Responds to questions appropriately with a normal affect.   Intake/Output Summary (Last 24 hours) at 07/01/15 1045 Last data filed at 07/01/15 0500  Gross per 24 hour  Intake   1280 ml  Output   3090 ml  Net  -1810 ml    Inpatient Medications:  . diltiazem  180 mg Oral Daily  . docusate sodium  100 mg Oral BID  . latanoprost  1 drop Both Eyes QHS  . pantoprazole (PROTONIX) IV  40 mg Intravenous  Q24H  . tamsulosin  0.4 mg Oral QPC supper   Infusions:    Labs:  Recent Labs  06/30/15 0211 07/01/15 0456  NA 138 140  K 3.9 3.9  CL 111 110  CO2 24 26  GLUCOSE 97 106*  BUN 14 17  CREATININE 0.82 0.87  CALCIUM 7.4* 7.8*  MG 1.7 2.3    Recent Labs  06/29/15 0154  AST 26  ALT 20  ALKPHOS 87  BILITOT 1.1  PROT 6.3*  ALBUMIN 3.3*    Recent Labs  06/29/15 0154  06/30/15 0211  06/30/15 2217 07/01/15 0456  WBC 10.3  < > 7.8  --   --  8.1  NEUTROABS 8.3*  --   --   --   --   --   HGB 10.7*  < > 8.8*  < > 9.9* 9.1*  HCT 32.4*  < > 26.9*  < > 29.9* 28.2*  MCV 85.6  < > 86.8  --   --  87.2  PLT 193  < > 159  --   --  197  < > = values in this interval not displayed.  Recent Labs  06/29/15 0154  CKTOTAL 141   Invalid input(s): POCBNP No results for input(s): HGBA1C in the last 72 hours.   Weights: Filed Weights   06/29/15 0118 06/30/15 0600 07/01/15 0746  Weight: 182 lb (82.555 kg) 188 lb 7.9 oz (85.5 kg) 188 lb 0.8 oz (85.3 kg)     Radiology/Studies:  Chest 2 View  06/19/2015  CLINICAL DATA:  Preop AAA and common iliac artery aneurysm repair EXAM: CHEST  2 VIEW COMPARISON:  12/05/2009 FINDINGS: Lungs are clear.  No pleural effusion or pneumothorax. The heart is normal in size. Degenerative changes of the visualized thoracolumbar spine. IMPRESSION: No evidence of acute cardiopulmonary disease. Electronically Signed   By: Julian Hy M.D.   On: 06/19/2015 08:10   Ct Angio Abdomen W/cm &/or Wo Contrast  06/29/2015  CLINICAL DATA:  Status post abdominal aortic aneurysm stent placement 3 days prior, now with right hip pain. Tender hematoma of right flank. EXAM: CT ANGIOGRAPHY ABDOMEN TECHNIQUE: Multidetector CT imaging of the abdomen was performed using the standard protocol during bolus administration of intravenous contrast. Multiplanar reconstructed images including MIPs were obtained and reviewed to evaluate the vascular anatomy. CONTRAST:  11mL  OMNIPAQUE IOHEXOL 350 MG/ML SOLN COMPARISON:  Preoperative CT 06/03/2015 FINDINGS: Large right retroperitoneal hematoma with heterogeneous stranding and soft tissue density in the right retroperitoneal space. Hematoma tracks laterally about the extraperitoneal space adjacent to the iliacus musculature. There is mild soft tissue stranding extending into the right inguinal canal. No pseudoaneurysm formation. Tiny focus of hyperdensity within the hematoma, series 5, image 162, likely reflects small focus of active bleeding. Aorto bi-iliac stent placement aneurysm sac measures 4.5 cm, unchanged. No extraluminal contrast within the aneurysm sac to suggest endoleak. Small focus of air is likely postsurgical. Bilateral common iliac arteries are patent. Set celiac, superior mesenteric, and inferior mesenteric arteries are patent. Single bilateral renal arteries are patent. Mild atelectasis in the lingula, lung bases otherwise clear. Multiple cysts in the liver. The gallbladder, spleen, pancreas and adrenal glands are unchanged in appearance with small subcentimeter left adrenal myelolipoma. Small hiatal hernia, stomach is decompressed. No small bowel dilatation. Moderate colonic stool without colonic wall thickening. Distal colonic diverticulosis without diverticulitis. Bladder is mildly distended. Prostate gland appears prominent. Trace dependent free fluid in the pelvis. Postsurgical and degenerative change throughout the lumbar spine. Sclerosis involving sacrum, unchanged. Advanced degenerative change of both hips. Atrophy of the right gluteal musculature. Review of the MIP images confirms the above findings. IMPRESSION: 1. Large right retroperitoneal hematoma. Suspect small focus of active bleeding. 2. Post aorta bi-iliac stent placement with unchanged size of the aneurysm sac. No evidence of endoleak. These results were called by telephone at the time of interpretation on 06/29/2015 at 3:42 am to Dr. Hinda Kehr , who  verbally acknowledged these results. Electronically Signed   By: Jeb Levering M.D.   On: 06/29/2015 03:44   Dg Hip Unilat  With Pelvis 2-3 Views Right  06/29/2015  CLINICAL DATA:  Lateral right hip pain for 3 days, worse this morning. No trauma. EXAM: DG HIP (WITH OR WITHOUT PELVIS) 2-3V RIGHT COMPARISON:  None. FINDINGS: Prominent degenerative changes in both hips with diffuse narrowing of the acetabular joint space, sclerosis, and hypertrophic changes on both sides of the joint. The fragments suggested at the medial aspect of the right acetabular joint. This could represent hypertrophic change or loose body. No evidence of acute fracture or dislocation in the right hip. No destructive bone lesions. Pelvis appears intact. SI joints and symphysis pubis are not displaced. Postoperative changes in the lower lumbar spine. Aortoiliac stent grafts. Vascular calcifications. IMPRESSION: Prominent degenerative changes in both hips. Possible loose body in the right hip. No acute fracture or dislocation. Electronically Signed   By: Lucienne Capers M.D.   On: 06/29/2015 02:00   Ct Angio Abd/pel W/ And/or W/o  06/03/2015  CLINICAL DATA:  Evaluate known abdominal aortic aneurysm. EXAM: CT ANGIOGRAPHY ABDOMEN AND PELVIS WITH CONTRAST TECHNIQUE: Multidetector CT imaging of the abdomen and pelvis  was performed using the standard protocol during bolus administration of intravenous contrast. Multiplanar reconstructed images including MIPs were obtained and reviewed to evaluate the vascular anatomy. CONTRAST:  163mL OMNIPAQUE IOHEXOL 350 MG/ML SOLN COMPARISON:  Aortic ultrasound - 03/11/2011; CTA of the abdomen pelvis - 04/06/2013 FINDINGS: Vascular Findings: Abdominal aorta: Interval enlargement in size infrarenal abdominal aortic aneurysm, now measuring 4.6 x 4.7 x 4.7 cm as measured in greatest oblique short axis axial (image 116, series 4), coronal (coronal image 61, series 8) and sagittal (sagittal image 95, series  9) previously, 3.8 x 4.2 x 4.1 cm. The cranial aspect of the aneurysm originates approximately 5.8 cm caudal to the take-off of the most inferior left renal artery. The aneurysm is again noted to extends to asymmetrically involve the proximal aspect of the left common iliac artery. There is a persistent moderate amount of eccentric mixed calcified and noncalcified slightly irregular thrombus throughout the dominant component of the aneurysm. No abdominal aortic dissection or periaortic stranding. Celiac artery: There is a minimal amount of eccentric mixed calcified and noncalcified atherosclerotic plaque involving the cranial aspect of the origin of the celiac artery, not definitely resulting in a hemodynamically significant stenosis. The left hepatic artery is incidentally noted to arise from the left gastric artery. Otherwise, conventional branching pattern. SMA: There is a minimal amount of eccentric mixed calcified and noncalcified atherosclerotic plaque involving the origin of the SMA, not resulting in hemodynamically significant stenosis. Conventional branching pattern. The distal tributaries of the SMA are widely patent without discrete intraluminal filling defect to suggest distal embolism. Right Renal artery: Solitary; there is a minimal amount of eccentric mixed calcified atherosclerotic plaque involving the origin the right renal artery, not resulting in hemodynamically significant stenosis. No definitive vessel irregularity to suggest system deep. Left Renal artery: Solitary; widely patent without hemodynamically significant narrowing. No vessel irregularity to suggest FMD. IMA: Diseased at its origin though remains patent. Right-sided pelvic vasculature: As above, there is extension of the infrarenal abdominal aortic aneurysm to involve the proximal approximately 3 cm of the right common iliac artery. There is a moderate amount of eccentric mixed calcified and noncalcified atherosclerotic plaque  throughout the right common iliac artery, not resulting in a hemodynamically significant stenosis. The right internal iliac artery is heavily diseased though patent and of normal caliber. There is a moderate amount of eccentric mixed calcified and noncalcified atherosclerotic plaque throughout the right external iliac artery, not definitely resulting in hemodynamically significant stenosis. Left-sided pelvic vasculature: There is a moderate amount of eccentric mixed calcified and noncalcified atherosclerotic plaque throughout the left common and external iliac arteries, not definitely resulting in hemodynamically significant stenosis. The left internal iliac artery is heavily disease though patent and of normal caliber. Review of the MIP images confirms the above findings. -------------------------------------------------------------------------------- Nonvascular Findings: Evaluation of the abdominal organs is largely limited to the arterial phase of enhancement. Normal hepatic contour. Re- demonstrated multiple hepatic cysts with dominant cyst within the medial segment of the left lobe of the liver measuring 5.2 cm in diameter (image 42, series 4). Additional scattered sub cm hypoattenuating hepatic lesions are too small to adequately characterize though morphologically similar to the 03/2013 examination and favored to represent additional hepatic cysts. Normal appearance of the gallbladder given degree distention. No radiopaque gallstones. No intra extrahepatic biliary duct dilatation. No ascites. There is symmetric enhancement of the bilateral kidneys. No definite renal stones on this postcontrast examination. Scattered subcentimeter hypoattenuating left-sided renal lesions are too small likely characterize though favored  to represent renal cysts. No discrete right-sided renal lesions. No urinary obstruction or perinephric stranding. Normal appearance of the right adrenal gland, pancreas and spleen. Unchanged  approximately 1 cm fat containing adrenal myelolipoma within the crux of the left adrenal gland (image 56, series 4). Colonic diverticulosis without evidence diverticulitis. Moderate colonic stool burden without evidence of enteric obstruction. Normal appearance of the terminal ileum and appendix. No pneumoperitoneum, pneumatosis or portal venous gas. No bulky retroperitoneal, mesenteric, pelvic or inguinal lymphadenopathy. Borderline enlargement of the prostate gland with mass effect upon the undersurface of the urinary bladder. Normal appearance of the urinary bladder given degree of distention. No free fluid in the pelvic cul-de-sac. Normal heart size.  No pericardial effusion. No acute or aggressive osseous abnormalities. Post L4 and L5 paraspinal fusion and intervertebral disc space replacement, without evidence of hardware failure loosening. Sclerotic lesion within the sacrum is unchanged since the 03/2013 examination and thus favored to be benign etiology, likely a bone island (representative sagittal image 101, series 9). Mild to moderate multilevel lumbar spine DDD. Mild-to-moderate bilateral hip degenerative change, left greater than right. Small bilateral mesenteric fat containing inguinal hernias, left greater than right. Tiny mesenteric fat containing periumbilical hernia. IMPRESSION: Vascular Impression: 1. Interval increase in size of infrarenal abdominal aortic aneurysm, now measuring 4.7 cm in diameter, previously, 4.2 cm. The aneurysm is again noted to arise approximately 5.8 cm caudal to the take-off of the most inferior left renal artery and extend to involve the proximal 3 cm of the right common iliac artery. No evidence of abdominal aortic dissection or periaortic stranding. Nonvascular Impression: 1. Colonic diverticulosis without evidence of diverticulitis. Electronically Signed   By: Sandi Mariscal M.D.   On: 06/03/2015 12:22     Assessment and Recommendation  80 y.o. male with PVD with  retroperitoneal bleed now stable with paroxysmal non valve afib now in nsr and stable 1.no anticoagulation for at least three weeks due to bleeding 2. Diltiazem for heart rate control and maintinaing nsr without change 3. No further cardiac diagnostics needed 4. Ok for dc to home from cardiac standpint with fu 2-3wkds  Signed, Serafina Royals M.D. FACC

## 2015-07-01 NOTE — Progress Notes (Signed)
[place] [facility] [place] NAME: Jeremy Higgins    MR#:  [id number]  DATE OF BIRTH:  [birth year]  SUBJECTIVE:   Pt. Here due to right hip/flank pain and noted to have retroperitoneal hematoma.  Hg. Stable. No chest pain/shortness of breath.    REVIEW OF SYSTEMS:    Review of Systems  Constitutional: Negative for fever and chills.  HENT: Negative for congestion and tinnitus.   Eyes: Negative for blurred vision and double vision.  Respiratory: Negative for cough, shortness of breath and wheezing.   Cardiovascular: Negative for chest pain, orthopnea and PND.  Gastrointestinal: Negative for nausea, vomiting, abdominal pain and diarrhea.  Genitourinary: Negative for dysuria and hematuria.  Neurological: Negative for dizziness, sensory change and focal weakness.  All other systems reviewed and are negative.   Nutrition: Heart healthy Tolerating Diet: Yes Tolerating PT: Await evaluation   DRUG ALLERGIES:  No Known Allergies  VITALS:  Blood pressure 122/58, pulse 74, temperature 98.4 F (36.9 C), temperature source Oral, resp. rate 16, height 5\' 11"  (1.803 m), weight 85.3 kg (188 lb 0.8 oz), SpO2 98 %.  PHYSICAL EXAMINATION:   Physical Exam  GENERAL:  80 y.o.-year-old patient lying in the bed with no acute distress.  EYES: Pupils equal, round, reactive to light and accommodation. No scleral icterus. Extraocular muscles intact.  HEENT: Head atraumatic, normocephalic. Oropharynx and nasopharynx clear.  NECK:  Supple, no jugular venous distention. No thyroid enlargement, no tenderness.  LUNGS: Normal breath sounds bilaterally, no wheezing, rales, rhonchi. No use of accessory muscles of respiration.  CARDIOVASCULAR: S1, S2 normal. No murmurs, rubs, or gallops.  ABDOMEN: Soft, nontender, nondistended. Bowel sounds present. No organomegaly or mass.  EXTREMITIES: No cyanosis, clubbing or edema b/l.    NEUROLOGIC: Cranial nerves II through XII  are intact. No focal Motor or sensory deficits b/l.   PSYCHIATRIC: The patient is alert and oriented x 3.  SKIN: No obvious rash, lesion, or ulcer. Bruising and ecchymosis around the right hip area from the retroperitoneal hematoma.   LABORATORY PANEL:   CBC  Recent Labs Lab 07/01/15 0456 07/01/15 1154  WBC 8.1  --   HGB 9.1* 10.4*  HCT 28.2* 31.4*  PLT 197  --    ------------------------------------------------------------------------------------------------------------------  Chemistries   Recent Labs Lab 06/29/15 0154  07/01/15 0456  NA 139  < > 140  K 3.8  < > 3.9  CL 108  < > 110  CO2 24  < > 26  GLUCOSE 148*  < > 106*  BUN 18  < > 17  CREATININE 0.87  < > 0.87  CALCIUM 8.3*  < > 7.8*  MG  --   < > 2.3  AST 26  --   --   ALT 20  --   --   ALKPHOS 87  --   --   BILITOT 1.1  --   --   < > = values in this interval not displayed. ------------------------------------------------------------------------------------------------------------------  Cardiac Enzymes No results for input(s): TROPONINI in the last 168 hours. ------------------------------------------------------------------------------------------------------------------  RADIOLOGY:  No results found.   ASSESSMENT AND PLAN:   80 year old male with past medical history of chronic afibrillation, glaucoma, hypertension, hyperlipidemia, who presented to the hospital due to right hip/flank pain and noted to have a retroperitoneal hematoma.  #1 retroperitoneal hematoma-this is status post abdominal aortic aneurysm repair and patient being on Eliquis.  - Hg stable post transfusion.  No urgent surgical intervention as per  vascular surgery.  - cont. Supportive care  #2 history of chronic afibrillation-patient is currently rate controlled. Continue Cardizem. -Eliquis on hold given the retroperitoneal hematoma.   #3 glaucoma-continue latanoprost eyedrops.  #4 BPH-continue Flomax. - will d/c foley in  a.m.  #5 GERD-continue Protonix.  Will get PT eval.     All the records are reviewed and case discussed with Care Management/Social Workerr. Management plans discussed with the patient, family and they are in agreement.  CODE STATUS: Full  DVT Prophylaxis: Teds and SCDs  TOTAL TIME TAKING CARE OF THIS PATIENT: 25 minutes.   POSSIBLE D/C IN 1-2 DAYS, DEPENDING ON CLINICAL CONDITION.   Henreitta Leber M.D on 07/01/2015 at 1:30 PM  Between 7am to 6pm - Pager - [phone]  After 6pm go to [email or web address] - password [email or web address] [facility]  Office  [phone]  CC: Primary care physician; BABAOFF, Caryl Bis, MD

## 2015-07-01 NOTE — Plan of Care (Signed)
Problem: Fluid Volume: Goal: Will show no signs and symptoms of excessive bleeding Outcome: Progressing Patient transferred from CCU. VSS. Pain medications given with noted relief. Bilateral incisions dry. Foley in place and draining. Will call for assistance.

## 2015-07-01 NOTE — Progress Notes (Signed)
Key Points: Use following P&T approved IV to PO antibiotic change policy.  Description contains the criteria that are approved Note: Policy Excludes:  Esophagectomy patientsPHARMACIST - PHYSICIAN COMMUNICATION DR:   Delana Meyer CONCERNING: IV to Oral Route Change Policy  RECOMMENDATION: This patient is receiving pantoprazole by the intravenous route.  Based on criteria approved by the Pharmacy and Therapeutics Committee, the intravenous medication(s) is/are being converted to the equivalent oral dose form(s).   DESCRIPTION: These criteria include:  The patient is eating (either orally or via tube) and/or has been taking other orally administered medications for a least 24 hours  The patient has no evidence of active gastrointestinal bleeding or impaired GI absorption (gastrectomy, short bowel, patient on TNA or NPO).  If you have questions about this conversion, please contact the Pharmacy Department  []   [phone] )  [facility] [x]   [phone] )  [facility] []   [phone] )  [facility] []   [phone] )  [facility] []   [phone] )  [place], [facility] 07/01/2015 1:00 PM

## 2015-07-02 LAB — TYPE AND SCREEN
ABO/RH(D): O POS
ANTIBODY SCREEN: NEGATIVE
Unit division: 0
Unit division: 0
Unit division: 0
Unit division: 0

## 2015-07-02 NOTE — Care Management Important Message (Signed)
Important Message  Patient Details  Name: Jeremy Higgins MRN: [id number] Date of Birth: 08-16-1934   Medicare Important Message Given:  Yes    Marshell Garfinkel, RN 07/02/2015, 8:42 AM

## 2015-07-02 NOTE — Progress Notes (Signed)
[facility]  Daily Progress Note   Subjective: Ambulated today with minimal difficulty. Foley was removed and he has been able to urinate without incident.  Objective: Filed Vitals:   07/02/15 0500 07/02/15 0540 07/02/15 0917 07/02/15 1402  BP:  127/57  127/58  Pulse:  84 124 95  Temp:  97.8 F (36.6 C)  98.6 F (37 C)  TempSrc:  Oral  Oral  Resp:  20  18  Height:      Weight: 84.097 kg (185 lb 6.4 oz)     SpO2:  97% 94% 100%    Intake/Output Summary (Last 24 hours) at 07/02/15 1945 Last data filed at 07/02/15 1937  Gross per 24 hour  Intake    720 ml  Output   4800 ml  Net  -4080 ml   Physical Exam: A&Ox3, NAD CV: Irregular Pulmonary: CTA Bilaterally Abdomen: Soft, Nontender, Nondistended Groin: Bilateral: No swelling / drainage Vascular: Left Lower Extremity: warm, non-tender with palpable pedal pulses Right Lower Extremity: warm, non-tender with palpable pedal pulses   Laboratory: CBC    Component Value Date/Time   WBC 8.1 07/01/2015 0456   HGB 10.4* 07/01/2015 1154   HCT 31.4* 07/01/2015 1154   PLT 197 07/01/2015 0456   BMET    Component Value Date/Time   NA 140 07/01/2015 0456   K 3.9 07/01/2015 0456   CL 110 07/01/2015 0456   CO2 26 07/01/2015 0456   GLUCOSE 106* 07/01/2015 0456   BUN 17 07/01/2015 0456   CREATININE 0.87 07/01/2015 0456   CALCIUM 7.8* 07/01/2015 0456   GFRNONAA >60 07/01/2015 0456   GFRAA >60 07/01/2015 0456   Assessment/Planning: 80 year old male admitted last night probable spontaneous retroperitoneal hemorrhage 1) Anti-coagulation held. We will check H&H tomorrow morning 2) DVT prophylaxis patient is on SCDs and TED hose no anticoagulation given his bleeding 3) Up to chair today and ambulated today 4) GI proph - Protonix 5) Bowel Regimen for constipation 6) Pain Control  Hortencia Pilar, MD 07/02/2015 7:45 PM

## 2015-07-02 NOTE — Plan of Care (Signed)
Problem: Fluid Volume: Goal: Will show no signs and symptoms of excessive bleeding Outcome: Progressing Patient alert and oriented. VSS. No complaints of pain. Foley removed and voiding good. Up with PT, walked around unit. Possible discharge tomorrow.

## 2015-07-02 NOTE — Progress Notes (Signed)
[facility] [place] NAME: Jeremy Higgins    MR#:  [id number]  DATE OF BIRTH:  April 16, 1935  SUBJECTIVE:   Pt. Here due to right hip/flank pain and noted to have retroperitoneal hematoma. Hg. Stable. No chest pain/shortness of breath.    REVIEW OF SYSTEMS:    Review of Systems  Constitutional: Negative for fever and chills.  HENT: Negative for congestion and tinnitus.   Eyes: Negative for blurred vision and double vision.  Respiratory: Negative for cough, shortness of breath and wheezing.   Cardiovascular: Negative for chest pain, orthopnea and PND.  Gastrointestinal: Negative for nausea, vomiting, abdominal pain and diarrhea.  Genitourinary: Negative for dysuria and hematuria.  Neurological: Negative for dizziness, sensory change and focal weakness.  All other systems reviewed and are negative.   Nutrition: Heart healthy Tolerating Diet: Yes Tolerating PT: Eval noted.   DRUG ALLERGIES:  No Known Allergies  VITALS:  Blood pressure 127/57, pulse 124, temperature 97.8 F (36.6 C), temperature source Oral, resp. rate 20, height 5\' 11"  (1.803 m), weight 84.097 kg (185 lb 6.4 oz), SpO2 94 %.  PHYSICAL EXAMINATION:   Physical Exam  GENERAL:  80 y.o.-year-old patient lying in the bed with no acute distress.  EYES: Pupils equal, round, reactive to light and accommodation. No scleral icterus. Extraocular muscles intact.  HEENT: Head atraumatic, normocephalic. Oropharynx and nasopharynx clear.  NECK:  Supple, no jugular venous distention. No thyroid enlargement, no tenderness.  LUNGS: Normal breath sounds bilaterally, no wheezing, rales, rhonchi. No use of accessory muscles of respiration.  CARDIOVASCULAR: S1, S2 normal. No murmurs, rubs, or gallops.  ABDOMEN: Soft, nontender, nondistended. Bowel sounds present. No organomegaly or mass.  EXTREMITIES: No cyanosis, clubbing or edema b/l.    NEUROLOGIC: Cranial nerves II through XII are  intact. No focal Motor or sensory deficits b/l.   PSYCHIATRIC: The patient is alert and oriented x 3.  SKIN: No obvious rash, lesion, or ulcer. Bruising and ecchymosis around the right hip/Flank area from the retroperitoneal hematoma.   LABORATORY PANEL:   CBC  Recent Labs Lab 07/01/15 0456 07/01/15 1154  WBC 8.1  --   HGB 9.1* 10.4*  HCT 28.2* 31.4*  PLT 197  --    ------------------------------------------------------------------------------------------------------------------  Chemistries   Recent Labs Lab 06/29/15 0154  07/01/15 0456  NA 139  < > 140  K 3.8  < > 3.9  CL 108  < > 110  CO2 24  < > 26  GLUCOSE 148*  < > 106*  BUN 18  < > 17  CREATININE 0.87  < > 0.87  CALCIUM 8.3*  < > 7.8*  MG  --   < > 2.3  AST 26  --   --   ALT 20  --   --   ALKPHOS 87  --   --   BILITOT 1.1  --   --   < > = values in this interval not displayed. ------------------------------------------------------------------------------------------------------------------  Cardiac Enzymes No results for input(s): TROPONINI in the last 168 hours. ------------------------------------------------------------------------------------------------------------------  RADIOLOGY:  No results found.   ASSESSMENT AND PLAN:   80 year old male with past medical history of chronic afibrillation, glaucoma, hypertension, hyperlipidemia, who presented to the hospital due to right hip/flank pain and noted to have a retroperitoneal hematoma.  #1 retroperitoneal hematoma-this is status post abdominal aortic aneurysm repair and patient being on Eliquis.  - Hg stable post transfusion.  No urgent surgical intervention as per vascular  surgery.  - cont. Supportive care  #2 history of chronic afibrillation-patient is currently rate controlled. Continue Cardizem. -Eliquis on hold given the retroperitoneal hematoma and can be resumed as outpatient after follow up with cardiology as outpatient.    #3  glaucoma-continue latanoprost eyedrops.  #4 BPH-continue Flomax. - foley out this a.m. And awaiting to void.   #5 GERD-continue Protonix.  PT eval noted.    Discussed w/ Dr. Delana Meyer and pt. Is medically stable for discharge and will sign off. Please call back if any further help needed.    All the records are reviewed and case discussed with Care Management/Social Workerr. Management plans discussed with the patient, family and they are in agreement.  CODE STATUS: Full  DVT Prophylaxis: Teds and SCDs  TOTAL TIME TAKING CARE OF THIS PATIENT: 25 minutes.    Henreitta Leber M.D on 07/02/2015 at 9:37 AM  Between 7am to 6pm - Pager - [phone]  After 6pm go to [email or web address] - password [email or web address]  Office  [phone]  CC: Primary care physician; BABAOFF, Caryl Bis, MD

## 2015-07-02 NOTE — Evaluation (Signed)
Physical Therapy Evaluation Patient Details Name: Jeremy Higgins MRN: [id number] DOB: 01-29-1935 Today's Date: 07/02/2015   History of Present Illness  presented to ER with acute onset of R hip pain (after AAA repair with bilat iliac artery repair 12/15); admitted with retroperitoneal bleed.  Admission HgB 7.4; now improved to 10.4 after transfusions.  No surgical intervention recommended at this time.  Clinical Impression  Upon evaluation, patient alert and oriented; follows all commands and demonstrates good safety awareness/insight.  Bilat UE/LE strength and ROM grossly symmetrical and WFL (except R ankle DF 0/5, baseline due to polio as child).  Able to complete bed mobility indep; sit/stand, basic transfers and gait (220') with RW, cga/close sup.  Vitals stable and WFL, but mod fatigue after above gait distance (BORG 8/10).  Speed slow, but fairly stable, throughout distance.  Encouraged continued gait with nursing as appropriate to promote functional endurance/activity tolerance. Would benefit from skilled PT to address above deficits and promote optimal return to PLOF; recommend follow up with outpatient PT as appropriate to address higher-level balance and cardiopulmonary endurance deficits.    Follow Up Recommendations Outpatient PT    Equipment Recommendations       Recommendations for Other Services       Precautions / Restrictions Precautions Precautions: Fall Restrictions Weight Bearing Restrictions: No      Mobility  Bed Mobility Overal bed mobility: Independent                Transfers Overall transfer level: Needs assistance   Transfers: Sit to/from Stand Sit to Stand: Min guard            Ambulation/Gait Ambulation/Gait assistance: Supervision Ambulation Distance (Feet): 220 Feet Assistive device: Rolling walker (2 wheeled)   Gait velocity: 10' walk time, 11-12 seconds Gait velocity interpretation: <1.8 ft/sec, indicative of risk for recurrent  falls General Gait Details: partially reciprocal stepping pattern with steppage gait R LE (due to chronic foot drop); slow, but steady, gait performance.  Do recommend continued use of RW for external stability and energy conservation; patient voices agreement.  Stairs            Wheelchair Mobility    Modified Rankin (Stroke Patients Only)       Balance Overall balance assessment: Needs assistance Sitting-balance support: No upper extremity supported;Feet supported Sitting balance-Leahy Scale: Normal     Standing balance support: Bilateral upper extremity supported;No upper extremity supported Standing balance-Leahy Scale: Fair                               Pertinent Vitals/Pain Pain Assessment: 0-10 Pain Score: 2  Pain Location: R hip Pain Descriptors / Indicators: Aching Pain Intervention(s): Limited activity within patient's tolerance;Monitored during session;Repositioned    Home Living Family/patient expects to be discharged to:: Private residence Living Arrangements: Spouse/significant other Available Help at Discharge: Family Type of Home: House Home Access: Stairs to enter Entrance Stairs-Rails: Right Entrance Stairs-Number of Steps: 3 Home Layout: One level Home Equipment: Environmental consultant - 2 wheels;Cane - single point      Prior Function Level of Independence: Independent         Comments: Indep with household and community mobility; recent use of SPC since AAA repair.  Denies fall history.     Hand Dominance        Extremity/Trunk Assessment   Upper Extremity Assessment: Overall WFL for tasks assessed  Lower Extremity Assessment: Overall WFL for tasks assessed (except R ankle DF 0/5 (baseline due to history of polio))         Communication   Communication: No difficulties  Cognition Arousal/Alertness: Awake/alert Behavior During Therapy: WFL for tasks assessed/performed Overall Cognitive Status: Within Functional  Limits for tasks assessed                      General Comments      Exercises        Assessment/Plan    PT Assessment Patient needs continued PT services  PT Diagnosis Difficulty walking;Generalized weakness   PT Problem List Decreased strength;Decreased range of motion;Decreased activity tolerance;Decreased balance;Decreased mobility;Decreased knowledge of use of DME;Decreased safety awareness;Decreased knowledge of precautions;Cardiopulmonary status limiting activity;Pain  PT Treatment Interventions DME instruction;Gait training;Stair training;Functional mobility training;Therapeutic activities;Therapeutic exercise;Balance training;Patient/family education   PT Goals (Current goals can be found in the Care Plan section) Acute Rehab PT Goals Patient Stated Goal: "to go back home" PT Goal Formulation: With patient Time For Goal Achievement: 07/16/15 Potential to Achieve Goals: Good    Frequency Min 2X/week   Barriers to discharge        Co-evaluation               End of Session Equipment Utilized During Treatment: Gait belt Activity Tolerance: Patient tolerated treatment well Patient left: in chair;with call bell/phone within reach;with chair alarm set Nurse Communication: Mobility status         Time: [id number] PT Time Calculation (min) (ACUTE ONLY): 17 min   Charges:   PT Evaluation $Initial PT Evaluation Tier I: 1 Procedure PT Treatments $Gait Training: 8-22 mins   PT G Codes:       Dabney Dever H. Owens Shark, PT, DPT, NCS 07/02/2015, 9:24 AM [phone]

## 2015-07-02 NOTE — Care Management (Signed)
Received from ICU. PT is recommending OPPT which would be obtained through Rx for patient to take to OPPT of his choice. He states he would like to return home. No RNCM needs identified.

## 2015-07-02 NOTE — Plan of Care (Signed)
Problem: Fluid Volume: Goal: Will show no signs and symptoms of excessive bleeding Outcome: Progressing Pt transferred from CCU yesterday. No complaints of pain. Foley in place for urinary retention. Bilateral groin incisions with no signs/symptoms of infection.

## 2015-07-03 LAB — CBC
HCT: 32.8 % — ABNORMAL LOW (ref 40.0–52.0)
Hemoglobin: 10.6 g/dL — ABNORMAL LOW (ref 13.0–18.0)
MCH: 28.4 pg (ref 26.0–34.0)
MCHC: 32.3 g/dL (ref 32.0–36.0)
MCV: 88.1 fL (ref 80.0–100.0)
PLATELETS: 260 10*3/uL (ref 150–440)
RBC: 3.72 MIL/uL — ABNORMAL LOW (ref 4.40–5.90)
RDW: 14.6 % — ABNORMAL HIGH (ref 11.5–14.5)
WBC: 7.9 10*3/uL (ref 3.8–10.6)

## 2015-07-03 NOTE — Progress Notes (Signed)
MD making rounds. Discharge orders received. No Appointments to schedule. IVs removed. No prescriptions. Discharge paperwork provided, explained, signed and witnessed. Education handouts provided to patient. No unanswered questions. Escorted via wheelchair by [facility] representative. All belongings sent with patient and family.

## 2015-07-03 NOTE — Discharge Instructions (Signed)
Hematoma  A hematoma is a collection of blood. The collection of blood can turn into a hard, painful lump under the skin. Your skin may turn blue or yellow if the hematoma is close to the surface of the skin. Most hematomas get better in a few days to weeks. Some hematomas are serious and need medical care. Hematomas can be very small or very big.  HOME CARE   Apply ice to the injured area:    Put ice in a plastic bag.    Place a towel between your skin and the bag.    Leave the ice on for 20 minutes, 2-3 times a day for the first 1 to 2 days.   After the first 2 days, switch to using warm packs on the injured area.   Raise (elevate) the injured area to lessen pain and puffiness (swelling). You may also wrap the area with an elastic bandage. Make sure the bandage is not wrapped too tight.   If you have a painful hematoma on your leg or foot, you may use crutches for a couple days.   Only take medicines as told by your doctor.  GET HELP RIGHT AWAY IF:    Your pain gets worse.   Your pain is not controlled with medicine.   You have a fever.   Your puffiness gets worse.   Your skin turns more blue or yellow.   Your skin over the hematoma breaks or starts bleeding.   Your hematoma is in your chest or belly (abdomen) and you are short of breath, feel weak, or have a change in consciousness.   Your hematoma is on your scalp and you have a headache that gets worse or a change in alertness or consciousness.  MAKE SURE YOU:    Understand these instructions.   Will watch your condition.   Will get help right away if you are not doing well or get worse.     This information is not intended to replace advice given to you by your health care provider. Make sure you discuss any questions you have with your health care provider.     Document Released: 08/06/2004 Document Revised: 03/01/2013 Document Reviewed: 12/07/2012  Elsevier Interactive Patient Education 2016 [employer]

## 2015-07-03 NOTE — Plan of Care (Signed)
Problem: Fluid Volume: Goal: Will show no signs and symptoms of excessive bleeding Outcome: Progressing Pt with no complaints. Voiding without difficulty post foley removal 600/700 mls. Possible discharge home today.

## 2015-07-17 NOTE — Discharge Summary (Signed)
[facility]    Discharge Summary    Patient ID:  Jeremy Higgins MRN: [id number] DOB/AGE: [birth year] 81 y.o.  Admit date: 06/29/2015 Discharge date: 07/17/2015 Date of Surgery: * No surgery found * Surgeon:   Admission Diagnosis: Hematoma [T14.8] Right hip pain [M25.551] History of AAA (abdominal aortic aneurysm) repair [Z98.890] Nontraumatic retroperitoneal hematoma [K66.1] Abdominal pain [R10.9] Other postoperative complication involving circulatory system [I97.89]  Discharge Diagnoses:  Hematoma [T14.8] Right hip pain [M25.551] History of AAA (abdominal aortic aneurysm) repair [Z98.890] Nontraumatic retroperitoneal hematoma [K66.1] Abdominal pain [R10.9] Other postoperative complication involving circulatory system [I97.89]  Secondary Diagnoses: Past Medical History  Diagnosis Date  . Glaucoma   . Arthritis     right foot andankle   . Atrial fibrillation ([place]) 2013  . Aneurysm ([facility])   . Hypertension   . COPD (chronic obstructive pulmonary disease) ([facility])       Discharged Condition: stable  HPI:  The patient presented to the emergency room several days after successful endovascular repair of his abdominal aortic aneurysm with the abrupt onset of right-sided flank pain. Workup in the emergency room included a CT scan which demonstrated a right retroperitoneal hematoma. He was on anticoagulation for his atrial fibrillation and his anticoagulation was stopped. He was admitted to the intensive care unit for serial exams monitoring of his H&H. He was kept at bed rest initially.  Over the course of the next 4 days his hemoglobin stabilized he did require one blood transfusion. He was seen by cardiology in regards to anticoagulation for his atrial fibrillation. His anticoagulation is held for the time being. He was hemodynamically stable throughout the entire time and demonstrated no neurological changes or stroke symptoms. He denied chest pains. Over  the course of these 4 days he was gradually increased as regards to his activities he did require Foley but upon discontinuation of the Foley he was able to avoid. On hospital day #4 he was felt fit for discharge. He is discharged home in stable condition  Hospital Course:  ANDREI ROLDAN is a 81 y.o. male is S/P Right  Extubated:patient was not intubated  Physical exam:Groins are clean dry and intact no evidence of hematoma palpable pedal pulses  Post-op wounds clean, dry, intact or healing well Pt. Ambulating, voiding and taking PO diet without difficulty. Pt pain controlled with PO pain meds. Labs as below Complications: Apparently spontaneous retroperitoneal hematoma following endovascular repair of his abdominal aortic aneurysm  Consults:  Treatment Team:  Corey Skains, MD  Significant Diagnostic Studies: CBC Lab Results  Component Value Date   WBC 7.9 07/03/2015   HGB 10.6* 07/03/2015   HCT 32.8* 07/03/2015   MCV 88.1 07/03/2015   PLT 260 07/03/2015    BMET    Component Value Date/Time   NA 140 07/01/2015 0456   K 3.9 07/01/2015 0456   CL 110 07/01/2015 0456   CO2 26 07/01/2015 0456   GLUCOSE 106* 07/01/2015 0456   BUN 17 07/01/2015 0456   CREATININE 0.87 07/01/2015 0456   CALCIUM 7.8* 07/01/2015 0456   GFRNONAA >60 07/01/2015 0456   GFRAA >60 07/01/2015 0456   COAG Lab Results  Component Value Date   INR 1.23 06/30/2015   INR 1.46 06/29/2015   INR 1.08 06/18/2015     Disposition:  Discharge to :Home    Medication List    STOP taking these medications        ELIQUIS 5 MG Tabs tablet  Generic drug:  apixaban      TAKE these medications        DILTIAZEM CD 180 MG 24 hr capsule  Generic drug:  diltiazem  Take 180 mg by mouth daily.     folic acid 1 MG tablet  Commonly known as:  FOLVITE  Take 1 mg by mouth daily.     latanoprost 0.005 % ophthalmic solution  Commonly known as:  XALATAN  Apply 1 drop to eye at bedtime.     lisinopril 10  MG tablet  Commonly known as:  PRINIVIL,ZESTRIL  Take 10 mg by mouth daily. Reported on 06/26/2015     lovastatin 40 MG tablet  Commonly known as:  MEVACOR  Take 40 mg by mouth at bedtime.     metoprolol tartrate 25 MG tablet  Commonly known as:  LOPRESSOR  Take 12.5 mg by mouth as needed.     sildenafil 100 MG tablet  Commonly known as:  VIAGRA  Take 100 mg by mouth as needed for erectile dysfunction.     vitamin C 500 MG tablet  Commonly known as:  ASCORBIC ACID  Take 500 mg by mouth daily.       Verbal and written Discharge instructions given to the patient. Wound care per Discharge AVS   Signed: [name], MD  07/17/2015, 8:20 AM

## 2015-07-24 ENCOUNTER — Encounter: Payer: Self-pay | Admitting: *Deleted

## 2015-09-19 ENCOUNTER — Ambulatory Visit: Payer: Self-pay

## 2015-09-19 ENCOUNTER — Ambulatory Visit (INDEPENDENT_AMBULATORY_CARE_PROVIDER_SITE_OTHER): Payer: Medicare Other | Admitting: General Surgery

## 2015-09-19 ENCOUNTER — Encounter: Payer: Self-pay | Admitting: General Surgery

## 2015-09-19 VITALS — BP 128/68 | HR 72 | Resp 14 | Ht 71.0 in | Wt 179.0 lb

## 2015-09-19 DIAGNOSIS — I6529 Occlusion and stenosis of unspecified carotid artery: Secondary | ICD-10-CM

## 2015-09-19 NOTE — Patient Instructions (Signed)
Follow up in 1 year.

## 2015-09-19 NOTE — Progress Notes (Signed)
Patient ID: Jeremy Higgins, male   DOB: [birth year], 81 y.o.   MRN: [id number]  Chief Complaint  Patient presents with  . Other    carotid     HPI Jeremy Higgins is a 81 y.o. male here today for a carotid ultrasound. In Dec 2016 underwent EVR for aortic aneurysm. He subsequently had a retroperitoneal hematoma which resolved with conservative management. Denies any neurological symptoms or events. I have reviewed the history of present illness with the patient. HPI  Past Medical History  Diagnosis Date  . Glaucoma   . Arthritis     right foot andankle   . Atrial fibrillation ([place]) 2013  . Aneurysm ([facility])   . Hypertension   . COPD (chronic obstructive pulmonary disease) [facility])     Past Surgical History  Procedure Laterality Date  . Foot surgery Right 1950's  . Spinal fusion  2011  . Cataract extraction  2012  . Colonoscopy  2010    ? [place] MD  . Leg surgery Right   . Peripheral vascular catheterization N/A 06/18/2015    Procedure: Abdominal Aortogram w/Lower Extremity;  Surgeon: [name], MD;  Location: [facility];  Service: Cardiovascular;  Laterality: N/A;  . Peripheral vascular catheterization  06/18/2015    Procedure: Lower Extremity Intervention;  Surgeon: [name], MD;  Location: [facility];  Service: Cardiovascular;;  . Peripheral vascular catheterization N/A 06/26/2015    Procedure: Endovascular Repair/Stent Graft;  Surgeon: [name], MD;  Location: [facility];  Service: Cardiovascular;  Laterality: N/A;    Family History  Problem Relation Age of Onset  . Heart attack Father     Social History Social History  Substance Use Topics  . Smoking status: Former Smoker -- 68 years    Quit date: 07/13/1998  . Smokeless tobacco: Never Used  . Alcohol Use: Yes     Comment: very rare    No Known Allergies  Current Outpatient Prescriptions  Medication Sig Dispense Refill  . diltiazem (DILTIAZEM CD) 180 MG 24 hr  capsule Take 180 mg by mouth daily.    Marland Kitchen ELIQUIS 5 MG TABS tablet     . folic acid (FOLVITE) 1 MG tablet Take 1 mg by mouth daily.    Marland Kitchen latanoprost (XALATAN) 0.005 % ophthalmic solution Apply 1 drop to eye at bedtime.    Marland Kitchen lisinopril (PRINIVIL,ZESTRIL) 10 MG tablet Take 5 mg by mouth daily. Reported on 06/26/2015    . lovastatin (MEVACOR) 40 MG tablet Take 40 mg by mouth at bedtime.    . metoprolol tartrate (LOPRESSOR) 25 MG tablet Take 12.5 mg by mouth as needed.    . sildenafil (VIAGRA) 100 MG tablet Take 100 mg by mouth as needed for erectile dysfunction.    . vitamin C (ASCORBIC ACID) 500 MG tablet Take 500 mg by mouth daily.     No current facility-administered medications for this visit.    Review of Systems Review of Systems  Constitutional: Negative.   Respiratory: Negative.   Cardiovascular: Negative.     Blood pressure 128/68, pulse 72, resp. rate 14, height 5\' 11"  (1.803 m), weight 179 lb (81.194 kg).  Physical Exam Physical Exam  Constitutional: He is oriented to person, place, and time. He appears well-developed and well-nourished.  Eyes: Conjunctivae are normal. No scleral icterus.  Cardiovascular: Normal rate and regular rhythm.   Neurological: He is alert and oriented to person, place, and time.  Skin: Skin is warm  and dry.    Data Reviewed Prior notes reviewed. Carotid doppler revels stable bilateral plaques, estimated stenosis of less than 25% Assessment    Carotid stenosis, stable, mild.    Plan       Patient to return in one year carotid ultrasound  PCP:  Babaoff This information has been scribed by Gaspar Cola CMA.   SANKAR,SEEPLAPUTHUR G 09/23/2015, 9:08 AM

## 2015-09-23 ENCOUNTER — Encounter: Payer: Self-pay | Admitting: General Surgery

## 2016-03-04 IMAGING — CT CT CTA ABD/PEL W/CM AND/OR W/O CM
2 of 9 series · 8 of 46 positions shown, 14 images · IV contrast (APPLIED)
Comparison: Aortic ultrasound - 03/11/2011; CTA of the abdomen
pelvis - 04/06/2013

CLINICAL DATA: Evaluate known abdominal aortic aneurysm.

EXAM:
CT ANGIOGRAPHY ABDOMEN AND PELVIS WITH CONTRAST
TECHNIQUE: Multidetector CT imaging of the abdomen and pelvis was performed
using the standard protocol during bolus administration of
intravenous contrast. Multiplanar reconstructed images including
MIPs were obtained and reviewed to evaluate the vascular anatomy.
CONTRAST:  100mL OMNIPAQUE IOHEXOL 350 MG/ML SOLN

[Series 6: venous 5.0 i30f 3 · axial · portal-venous · 0.79mm/px · z∈[-1080,-755]mm · 6 of 91 slices shown, 11 images]
[im 13/91  soft-tissue]
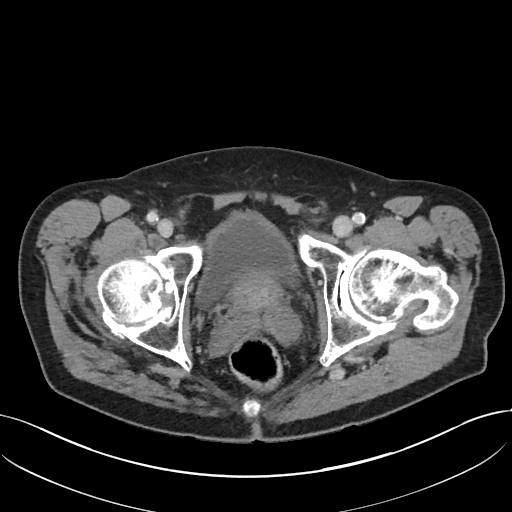
[im 13/91  bone]
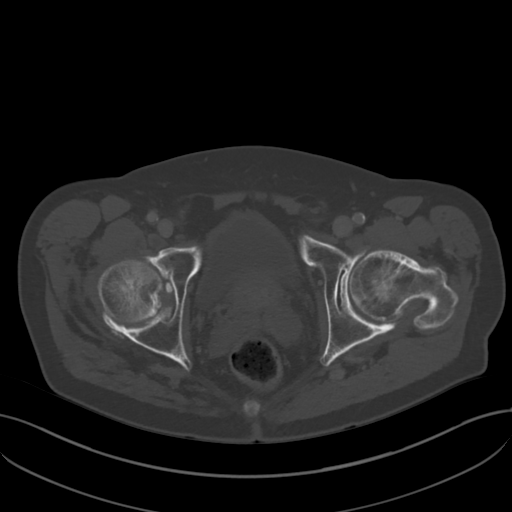
[im 26/91  soft-tissue]
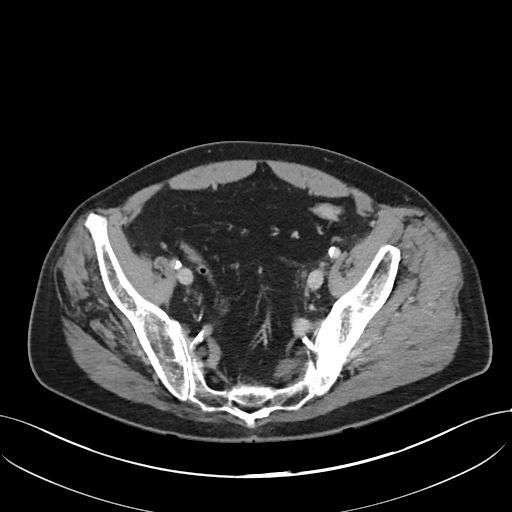
[im 39/91  soft-tissue]
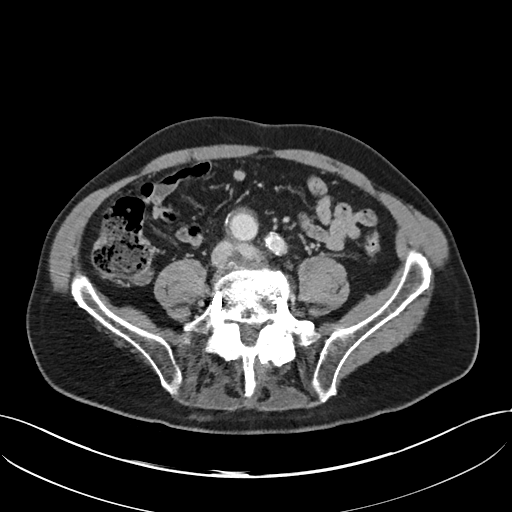
[im 39/91  lung]
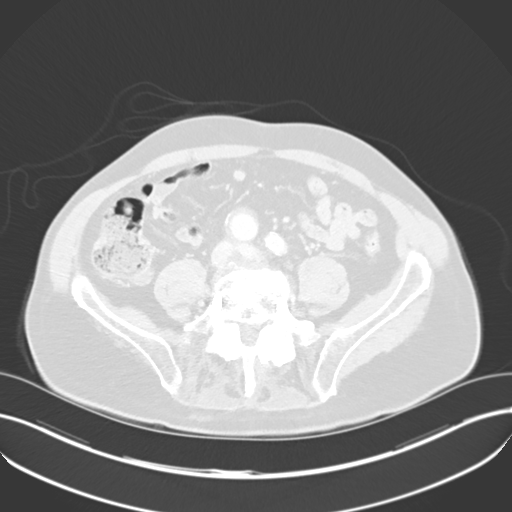
[im 52/91  soft-tissue]
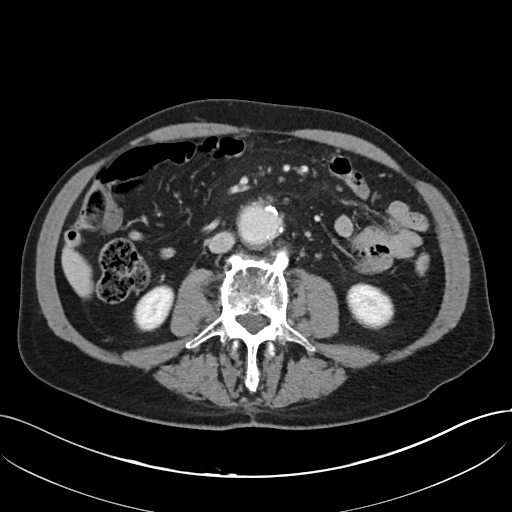
[im 52/91  lung]
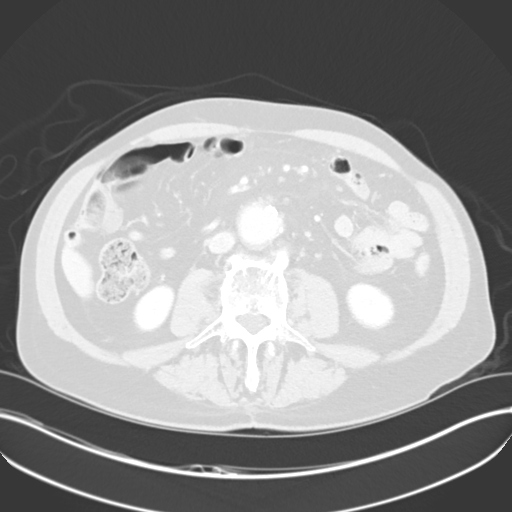
[im 65/91  soft-tissue]
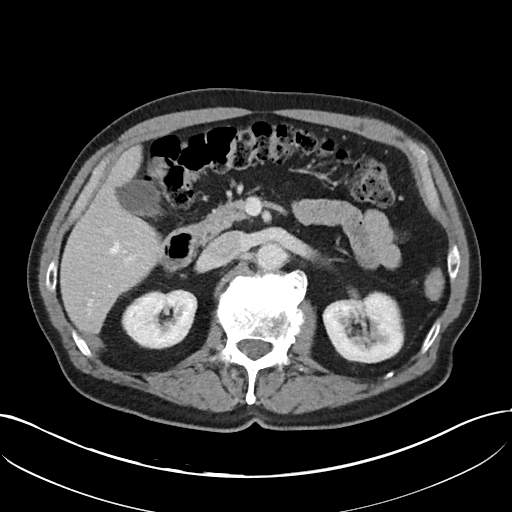
[im 65/91  lung]
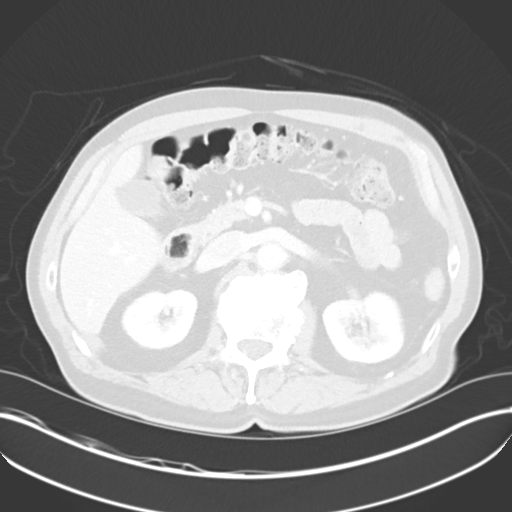
[im 78/91  soft-tissue]
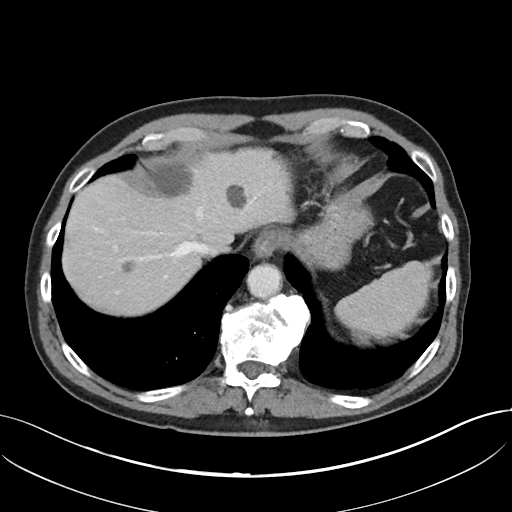
[im 78/91  lung]
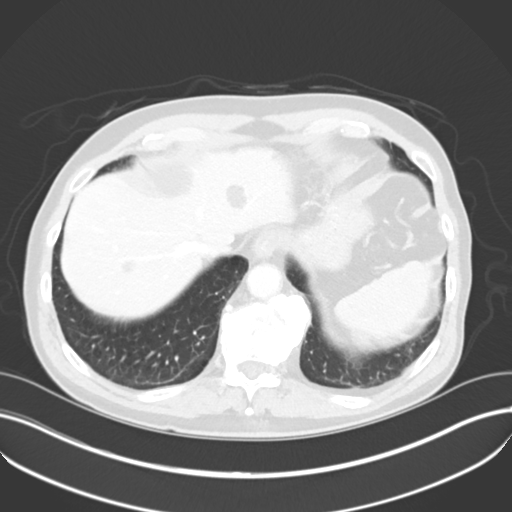

[Series 8: coronal mpr · coronal · 0.74mm/px · 2 of 145 slices shown, 3 images]
[im 49/145  soft-tissue]
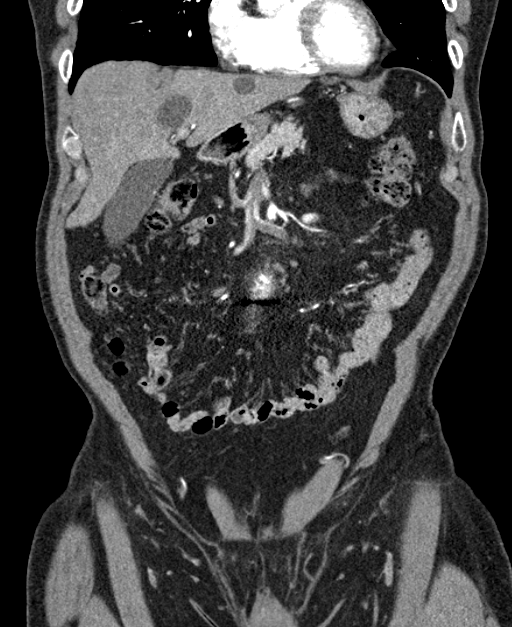
[im 49/145  bone]
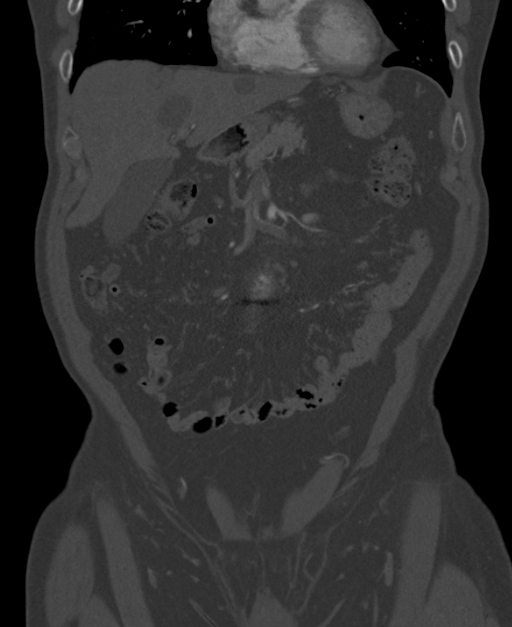
[im 97/145  soft-tissue]
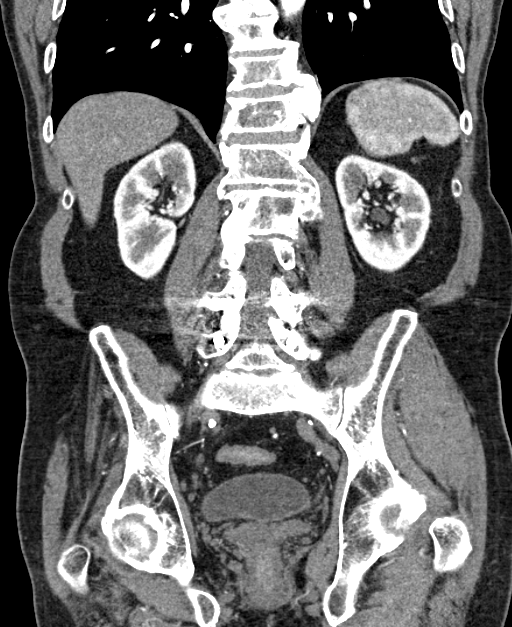

[8 of 46 positions shown; findings below may reference images not displayed]

FINDINGS: Vascular Findings:

Abdominal aorta: Interval enlargement in size infrarenal abdominal
aortic aneurysm, now measuring 4.6 x 4.7 x 4.7 cm as measured in
greatest oblique short axis axial (image 116, series 4), coronal
(coronal image 61, series 8) and sagittal (sagittal image 95, series
9) previously, 3.8 x 4.2 x 4.1 cm.

The cranial aspect of the aneurysm originates approximately 5.8 cm
caudal to the take-off of the most inferior left renal artery. The
aneurysm is again noted to extends to asymmetrically involve the
proximal aspect of the left common iliac artery.

There is a persistent moderate amount of eccentric mixed calcified
and noncalcified slightly irregular thrombus throughout the dominant
component of the aneurysm. No abdominal aortic dissection or
periaortic stranding.

Celiac artery: There is a minimal amount of eccentric mixed
calcified and noncalcified atherosclerotic plaque involving the
cranial aspect of the origin of the celiac artery, not definitely
resulting in a hemodynamically significant stenosis. The left
hepatic artery is incidentally noted to arise from the left gastric
artery. Otherwise, conventional branching pattern.

SMA: There is a minimal amount of eccentric mixed calcified and
noncalcified atherosclerotic plaque involving the origin of the SMA,
not resulting in hemodynamically significant stenosis. Conventional
branching pattern. The distal tributaries of the SMA are widely
patent without discrete intraluminal filling defect to suggest
distal embolism.

Right Renal artery: Solitary; there is a minimal amount of eccentric
mixed calcified atherosclerotic plaque involving the origin the
right renal artery, not resulting in hemodynamically significant
stenosis. No definitive vessel irregularity to suggest system deep.

Left Renal artery: Solitary; widely patent without hemodynamically
significant narrowing. No vessel irregularity to suggest FMD.

IMA: Diseased at its origin though remains patent.

Right-sided pelvic vasculature: As above, there is extension of the
infrarenal abdominal aortic aneurysm to involve the proximal
approximately 3 cm of the right common iliac artery. There is a
moderate amount of eccentric mixed calcified and noncalcified
atherosclerotic plaque throughout the right common iliac artery, not
resulting in a hemodynamically significant stenosis. The right
internal iliac artery is heavily diseased though patent and of
normal caliber. There is a moderate amount of eccentric mixed
calcified and noncalcified atherosclerotic plaque throughout the
right external iliac artery, not definitely resulting in
hemodynamically significant stenosis.

Left-sided pelvic vasculature: There is a moderate amount of
eccentric mixed calcified and noncalcified atherosclerotic plaque
throughout the left common and external iliac arteries, not
definitely resulting in hemodynamically significant stenosis. The
left internal iliac artery is heavily disease though patent and of
normal caliber.

Review of the MIP images confirms the above findings.

--------------------------------------------------------------------------------

Nonvascular Findings:

Evaluation of the abdominal organs is largely limited to the
arterial phase of enhancement.

Normal hepatic contour. Re- demonstrated multiple hepatic cysts with
dominant cyst within the medial segment of the left lobe of the
liver measuring 5.2 cm in diameter (image 42, series 4). Additional
scattered sub cm hypoattenuating hepatic lesions are too small to
adequately characterize though morphologically similar to the
[DATE] examination and favored to represent additional hepatic
cysts. Normal appearance of the gallbladder given degree distention.
No radiopaque gallstones. No intra extrahepatic biliary duct
dilatation. No ascites.

There is symmetric enhancement of the bilateral kidneys. No definite
renal stones on this postcontrast examination. Scattered
subcentimeter hypoattenuating left-sided renal lesions are too small
likely characterize though favored to represent renal cysts. No
discrete right-sided renal lesions. No urinary obstruction or
perinephric stranding. Normal appearance of the right adrenal gland,
pancreas and spleen. Unchanged approximately 1 cm fat containing
adrenal myelolipoma within the crux of the left adrenal gland (image
56, series 4).

Colonic diverticulosis without evidence diverticulitis. Moderate
colonic stool burden without evidence of enteric obstruction. Normal
appearance of the terminal ileum and appendix. No pneumoperitoneum,
pneumatosis or portal venous gas.

No bulky retroperitoneal, mesenteric, pelvic or inguinal
lymphadenopathy.

Borderline enlargement of the prostate gland with mass effect upon
the undersurface of the urinary bladder. Normal appearance of the
urinary bladder given degree of distention. No free fluid in the
pelvic cul-de-sac.

Normal heart size.  No pericardial effusion.

No acute or aggressive osseous abnormalities. Post L4 and L5
paraspinal fusion and intervertebral disc space replacement, without
evidence of hardware failure loosening. Sclerotic lesion within the
sacrum is unchanged since the [DATE] examination and thus favored
to be benign etiology, likely a bone island (representative sagittal
image 101, series 9). Mild to moderate multilevel lumbar spine DDD.
Mild-to-moderate bilateral hip degenerative change, left greater
than right.

Small bilateral mesenteric fat containing inguinal hernias, left
greater than right. Tiny mesenteric fat containing periumbilical
hernia.
IMPRESSION: Vascular Impression:

1. Interval increase in size of infrarenal abdominal aortic
aneurysm, now measuring 4.7 cm in diameter, previously, 4.2 cm. The
aneurysm is again noted to arise approximately 5.8 cm caudal to the
take-off of the most inferior left renal artery and extend to
involve the proximal 3 cm of the right common iliac artery. No
evidence of abdominal aortic dissection or periaortic stranding.
Nonvascular Impression:

1. Colonic diverticulosis without evidence of diverticulitis.

## 2016-05-07 ENCOUNTER — Other Ambulatory Visit (INDEPENDENT_AMBULATORY_CARE_PROVIDER_SITE_OTHER): Payer: Self-pay | Admitting: Vascular Surgery

## 2016-05-07 ENCOUNTER — Ambulatory Visit (INDEPENDENT_AMBULATORY_CARE_PROVIDER_SITE_OTHER): Payer: Medicare Other

## 2016-05-07 DIAGNOSIS — I70219 Atherosclerosis of native arteries of extremities with intermittent claudication, unspecified extremity: Secondary | ICD-10-CM

## 2016-05-07 DIAGNOSIS — Z95828 Presence of other vascular implants and grafts: Secondary | ICD-10-CM

## 2016-05-07 DIAGNOSIS — I714 Abdominal aortic aneurysm, without rupture, unspecified: Secondary | ICD-10-CM

## 2016-05-07 DIAGNOSIS — I739 Peripheral vascular disease, unspecified: Secondary | ICD-10-CM

## 2016-05-11 ENCOUNTER — Encounter (INDEPENDENT_AMBULATORY_CARE_PROVIDER_SITE_OTHER): Payer: Self-pay

## 2016-05-11 ENCOUNTER — Ambulatory Visit (INDEPENDENT_AMBULATORY_CARE_PROVIDER_SITE_OTHER): Payer: Medicare Other

## 2016-05-11 ENCOUNTER — Ambulatory Visit (INDEPENDENT_AMBULATORY_CARE_PROVIDER_SITE_OTHER): Payer: Medicare Other | Admitting: Vascular Surgery

## 2016-05-11 ENCOUNTER — Encounter (INDEPENDENT_AMBULATORY_CARE_PROVIDER_SITE_OTHER): Payer: Self-pay | Admitting: Vascular Surgery

## 2016-05-11 ENCOUNTER — Other Ambulatory Visit (INDEPENDENT_AMBULATORY_CARE_PROVIDER_SITE_OTHER): Payer: Self-pay | Admitting: Vascular Surgery

## 2016-05-11 ENCOUNTER — Encounter (INDEPENDENT_AMBULATORY_CARE_PROVIDER_SITE_OTHER): Payer: Medicare Other

## 2016-05-11 VITALS — BP 139/68 | HR 82 | Resp 16 | Ht 71.0 in | Wt 179.8 lb

## 2016-05-11 DIAGNOSIS — I714 Abdominal aortic aneurysm, without rupture, unspecified: Secondary | ICD-10-CM

## 2016-05-11 DIAGNOSIS — I10 Essential (primary) hypertension: Secondary | ICD-10-CM

## 2016-05-11 DIAGNOSIS — I70213 Atherosclerosis of native arteries of extremities with intermittent claudication, bilateral legs: Secondary | ICD-10-CM

## 2016-05-11 DIAGNOSIS — I739 Peripheral vascular disease, unspecified: Secondary | ICD-10-CM

## 2016-05-11 DIAGNOSIS — I70219 Atherosclerosis of native arteries of extremities with intermittent claudication, unspecified extremity: Secondary | ICD-10-CM | POA: Insufficient documentation

## 2016-05-11 DIAGNOSIS — I872 Venous insufficiency (chronic) (peripheral): Secondary | ICD-10-CM

## 2016-05-11 DIAGNOSIS — Z95828 Presence of other vascular implants and grafts: Secondary | ICD-10-CM | POA: Diagnosis not present

## 2016-05-11 DIAGNOSIS — I6529 Occlusion and stenosis of unspecified carotid artery: Secondary | ICD-10-CM | POA: Diagnosis not present

## 2016-05-11 NOTE — Progress Notes (Signed)
[facility] Admission History & Physical  MRN : [id number]  Jeremy Higgins is a 81 y.o. ([birth year]) male who presents with chief complaint of  Chief Complaint  Patient presents with  . Follow-up  .  History of Present Illness: The patient returns to the office for surveillance of an abdominal aortic aneurysm status post stent graft placement on 06/26/2015. Patient denies abdominal pain or back pain, no other abdominal complaints. No groin related complaints. No symptoms consistent with distal embolization. No changes in claudication distance. There have been no interval changes in his overall health care since his last visit. The size of the AAA prior to repair was 5.4.  Patient denies amaurosis fugax or TIA symptoms. There is no history of claudication or rest pain symptoms of the lower extremities. The patient denies angina or shortness of breath.  Duplex US of the aorta and iliac arteries shows a 4.3 cm AAA sac no endoleak, no sac growth; previously 4.3 cm AAA sac ABI's Rt=0.59 and Lt=0.72 (previous ABI's Rt=0.81 and Lt=0.83)  Current Meds  Medication Sig  . diltiazem (DILTIAZEM CD) 180 MG 24 hr capsule Take 180 mg by mouth daily.  Marland Kitchen ELIQUIS 5 MG TABS tablet   . folic acid (FOLVITE) 1 MG tablet Take 1 mg by mouth daily.  Marland Kitchen latanoprost (XALATAN) 0.005 % ophthalmic solution Apply 1 drop to eye at bedtime.  Marland Kitchen lisinopril (PRINIVIL,ZESTRIL) 10 MG tablet Take 5 mg by mouth daily. Reported on 06/26/2015  . lovastatin (MEVACOR) 40 MG tablet Take 40 mg by mouth at bedtime.  . metoprolol tartrate (LOPRESSOR) 25 MG tablet Take 12.5 mg by mouth as needed.  . sildenafil (VIAGRA) 100 MG tablet Take 100 mg by mouth as needed for erectile dysfunction.  . vitamin C (ASCORBIC ACID) 500 MG tablet Take 500 mg by mouth daily.    Past Medical History:  Diagnosis Date  . Aneurysm ([place])   . Arthritis    right foot andankle   . Atrial fibrillation ([place]) 2013  . COPD  (chronic obstructive pulmonary disease) ([place])   . Glaucoma   . Hypertension     Past Surgical History:  Procedure Laterality Date  . CATARACT EXTRACTION  2012  . COLONOSCOPY  2010   ? [place] MD  . FOOT SURGERY Right 1950's  . LEG SURGERY Right   . PERIPHERAL VASCULAR CATHETERIZATION N/A 06/18/2015   Procedure: Abdominal Aortogram w/Lower Extremity;  Surgeon: [name], MD;  Location: [facility];  Service: Cardiovascular;  Laterality: N/A;  . PERIPHERAL VASCULAR CATHETERIZATION  06/18/2015   Procedure: Lower Extremity Intervention;  Surgeon: [name], MD;  Location: [facility];  Service: Cardiovascular;;  . PERIPHERAL VASCULAR CATHETERIZATION N/A 06/26/2015   Procedure: Endovascular Repair/Stent Graft;  Surgeon: [name], MD;  Location: [facility];  Service: Cardiovascular;  Laterality: N/A;  . SPINAL FUSION  2011    Social History Social History  Substance Use Topics  . Smoking status: Former Smoker    Years: 40.00    Quit date: 07/13/1998  . Smokeless tobacco: Never Used  . Alcohol use Yes     Comment: very rare    Family History Family History  Problem Relation Age of Onset  . Heart attack Father   No family history of bleeding/clotting disorders, porphyria or autoimmune disease, there have been no new changes in his family history.   No Known Allergies   REVIEW OF SYSTEMS (Negative unless checked)  Constitutional: [] Weight loss  [] Fever  [] Chills Cardiac: [] Chest pain   [] Chest pressure   [] Palpitations   [] Shortness of breath when laying flat   [] Shortness of breath with exertion. Vascular:  [x] Pain in legs with walking   [] Pain in legs at rest  [] History of DVT   [] Phlebitis   [x] Swelling in legs   [] Varicose veins   [] Non-healing ulcers Pulmonary:   [] Uses home oxygen   [] Productive cough   [] Hemoptysis   [] Wheeze  [] COPD   [] Asthma Neurologic:  [] Dizziness   [] Seizures   [] History of stroke   [] History of TIA   [] Aphasia   [] Vissual changes   [] Weakness or numbness in arm   [] Weakness or numbness in leg Musculoskeletal:   [] Joint swelling   [x] Joint pain   [] Low back pain Hematologic:  [] Easy bruising  [] Easy bleeding   [] Hypercoagulable state   [] Anemic Gastrointestinal:  [] Diarrhea   [] Vomiting  [] Gastroesophageal reflux/heartburn   [] Difficulty swallowing. Genitourinary:  [] Chronic kidney disease   [] Difficult urination  [] Frequent urination   [] Blood in urine Skin:  [] Rashes   [] Ulcers  Psychological:  [] History of anxiety   []  History of major depression.  Physical Examination  Vitals:   05/11/16 0859  BP: 139/68  Pulse: 82  Resp: 16  Weight: 179 lb 12.8 oz (81.6 kg)  Height: 5\' 11"  (1.803 m)   Body mass index is 25.08 kg/m. Gen: WD/WN, NAD Head: [place]/AT, No temporalis wasting.  Ear/Nose/Throat: Hearing grossly intact, nares w/o erythema or drainage, poor dentition Eyes: PER, EOMI, sclera nonicteric.  Neck: Supple, no masses.  No bruit or JVD.  Pulmonary:  Good air movement, clear to auscultation bilaterally, no use of accessory muscles.  Cardiac: RRR, normal S1, S2, no Murmurs. Vascular:  Sluggish cap refill bilateral feet, 2+ edema moderate venous changes bilaterally Vessel Right Left  Radial Palpable Palpable  Ulnar Palpable Palpable  Brachial Palpable Palpable  Carotid Palpable Palpable  Femoral Palpable Palpable  Popliteal Not Palpable Not Palpable  PT Not Palpable Not Palpable  DP Not Palpable Not Palpable   Gastrointestinal: soft, non-distended. No guarding/no peritoneal signs.  Musculoskeletal: M/S 5/5 throughout.  No deformity or atrophy.  Neurologic: CN 2-12 intact. Pain and light touch intact in extremities.  Symmetrical.  Speech is fluent. Motor exam as listed above. Psychiatric: Judgment intact, Mood & affect appropriate for pt's clinical situation. Dermatologic: No rashes or ulcers noted.  No changes consistent with cellulitis. Lymph : No Cervical lymphadenopathy,  no lichenification or skin changes of chronic lymphedema.  CBC Lab Results  Component Value Date   WBC 7.9 07/03/2015   HGB 10.6 (L) 07/03/2015   HCT 32.8 (L) 07/03/2015   MCV 88.1 07/03/2015   PLT 260 07/03/2015    BMET    Component Value Date/Time   NA 140 07/01/2015 0456   K 3.9 07/01/2015 0456   CL 110 07/01/2015 0456   CO2 26 07/01/2015 0456   GLUCOSE 106 (H) 07/01/2015 0456   BUN 17 07/01/2015 0456   CREATININE 0.87 07/01/2015 0456   CALCIUM 7.8 (L) 07/01/2015 0456   GFRNONAA >60 07/01/2015 0456   GFRAA >60 07/01/2015 0456   CrCl cannot be calculated (Patient's most recent lab result is older than the maximum 21 days allowed.).  COAG Lab Results  Component Value Date   INR 1.23 06/30/2015   INR 1.46 06/29/2015   INR 1.08 06/18/2015    Radiology No results found.  Assessment/Plan 1. Abdominal aortic aneurysm (AAA) without rupture ([place]) Recommend: Patient  is status post successful endovascular repair of the AAA.   No further intervention is required at this time.   No endoleak is detected and the aneurysm sac is stable.  The patient will continue antiplatelet therapy as prescribed as well as aggressive management of hyperlipidemia. Exercise is again strongly encouraged.   However, endografts require continued surveillance with ultrasound or CT scan. This is mandatory to detect any changes that allow repressurization of the aneurysm sac.  The patient is informed that this would be asymptomatic.  The patient is reminded that lifelong routine surveillance is a necessity with an endograft. Patient will continue to follow-up at 6 month intervals with ultrasound of the aorta.  2. Atherosclerosis of native artery of both lower extremities with intermittent claudication (HCC)  Recommend:  The patient has evidence of atherosclerosis of the lower extremities with claudication.  The patient does not voice lifestyle limiting changes at this point in time.  Noninvasive  studies do not suggest clinically significant change.  No invasive studies, angiography or surgery at this time The patient should continue walking and begin a more formal exercise program.  The patient should continue antiplatelet therapy and aggressive treatment of the lipid abnormalities  No changes in the patient's medications at this time  The patient should continue wearing graduated compression socks 10-15 mmHg strength to control the mild edema.    3. Stenosis of carotid artery, unspecified laterality Recommend:  Given the patient's asymptomatic subcritical stenosis no further invasive testing or surgery at this time.  He will be due for his routine ultrasound with the next visit  Continue antiplatelet therapy as prescribed Continue management of CAD, HTN and Hyperlipidemia Healthy heart diet,  encouraged exercise at least 4 times per week Follow up in 6 months with duplex ultrasound and physical exam based on <50% stenosis of the bilateral carotid arteries  4. Chronic venous insufficiency No surgery or intervention at this point in time.    I have reviewed my previous discussion with the patient regarding venous insufficiency and why it  causes symptoms. I have discussed with the patient the chronic skin changes that accompany venous insufficiency and the long term sequela such as infection and ulceration.  Patient will  Continue wearing graduated compression stockings or compression wraps on a daily basis a prescription was given. The patient will put the stockings on first thing in the morning and removing them in the evening. The patient is instructed specifically not to sleep in the stockings.    In addition, behavioral modification including several periods of elevation of the lower extremities during the day will be continued. I have demonstrated that proper elevation is a position with the ankles at heart level.  The patient is instructed to begin routine exercise,  especially walking on a daily basis  Patient should undergo duplex ultrasound of the venous system to ensure that DVT or reflux is not present.  Following the review of the ultrasound the patient will follow up in 2-3 months to reassess the degree of swelling and the control that graduated compression stockings or compression wraps  is offering.   The patient can be assessed for a Lymph Pump at that time  5. Essential hypertension Continue medications as prescribed no changes    Hortencia Pilar, MD  05/11/2016 9:46 AM

## 2016-05-12 ENCOUNTER — Encounter (INDEPENDENT_AMBULATORY_CARE_PROVIDER_SITE_OTHER): Payer: Medicare Other

## 2016-08-20 ENCOUNTER — Encounter: Payer: Self-pay | Admitting: Cardiology

## 2016-09-22 ENCOUNTER — Encounter: Payer: Self-pay | Admitting: General Surgery

## 2016-09-22 ENCOUNTER — Ambulatory Visit (INDEPENDENT_AMBULATORY_CARE_PROVIDER_SITE_OTHER): Payer: Medicare Other | Admitting: General Surgery

## 2016-09-22 ENCOUNTER — Ambulatory Visit: Payer: Self-pay

## 2016-09-22 VITALS — BP 120/64 | HR 82 | Resp 14 | Ht 71.0 in | Wt 183.0 lb

## 2016-09-22 DIAGNOSIS — I6529 Occlusion and stenosis of unspecified carotid artery: Secondary | ICD-10-CM

## 2016-09-22 NOTE — Progress Notes (Signed)
Patient ID: Jeremy Higgins, male   DOB: 03/06/1935, 82 y.o.   MRN: [id number]  Chief Complaint  Patient presents with  . Follow-up    HPI Jeremy Higgins is a 82 y.o. male.  here today for a carotid ultrasound for asymptomatic carotid stenosis. Underwent EVR for aortic aneurysm in December, 2016. He subsequently had a retroperitoneal hematoma which resolved with conservative management. Denies any neurological symptoms or events. He states he is doing well. I have reviewed the history of present illness with the patient.   HPI  Past Medical History:  Diagnosis Date  . Aneurysm ([place])   . Arthritis    right foot andankle   . Atrial fibrillation ([place]) 2013  . COPD (chronic obstructive pulmonary disease) ([place])   . Glaucoma   . Hypertension     Past Surgical History:  Procedure Laterality Date  . CATARACT EXTRACTION  2012  . COLONOSCOPY  2010   ? [place] MD  . FOOT SURGERY Right 1950's  . LEG SURGERY Right   . PERIPHERAL VASCULAR CATHETERIZATION N/A 06/18/2015   Procedure: Abdominal Aortogram w/Lower Extremity;  Surgeon: [name], MD;  Location: [facility];  Service: Cardiovascular;  Laterality: N/A;  . PERIPHERAL VASCULAR CATHETERIZATION  06/18/2015   Procedure: Lower Extremity Intervention;  Surgeon: [name], MD;  Location: [facility];  Service: Cardiovascular;;  . PERIPHERAL VASCULAR CATHETERIZATION N/A 06/26/2015   Procedure: Endovascular Repair/Stent Graft;  Surgeon: [name], MD;  Location: [facility];  Service: Cardiovascular;  Laterality: N/A;  . SPINAL FUSION  2011    Family History  Problem Relation Age of Onset  . Heart attack Father     Social History Social History  Substance Use Topics  . Smoking status: Former Smoker    Years: 40.00    Quit date: 07/13/1998  . Smokeless tobacco: Never Used  . Alcohol use Yes     Comment: very rare    No Known Allergies  Current Outpatient Prescriptions  Medication Sig  Dispense Refill  . diltiazem (CARTIA XT) 180 MG 24 hr capsule take 1 capsule by mouth once daily    . ELIQUIS 5 MG TABS tablet Take 5 mg by mouth 2 (two) times daily.     . folic acid (FOLVITE) 1 MG tablet Take 1 mg by mouth daily.    Marland Kitchen latanoprost (XALATAN) 0.005 % ophthalmic solution Apply 1 drop to eye at bedtime.    Marland Kitchen lisinopril (PRINIVIL,ZESTRIL) 10 MG tablet Take 5 mg by mouth daily. Reported on 06/26/2015    . lovastatin (MEVACOR) 40 MG tablet Take 40 mg by mouth at bedtime.    . metoprolol tartrate (LOPRESSOR) 25 MG tablet Take 25 mg by mouth as needed.     . vitamin C (ASCORBIC ACID) 500 MG tablet Take 500 mg by mouth daily.     No current facility-administered medications for this visit.     Review of Systems Review of Systems  Constitutional: Negative.   Respiratory: Negative.   Cardiovascular: Negative.     Blood pressure 120/64, pulse 82, resp. rate 14, height 5\' 11"  (1.803 m), weight 183 lb (83 kg).  Physical Exam Physical Exam  Constitutional: He is oriented to person, place, and time. He appears well-developed and well-nourished.  Eyes: Conjunctivae are normal.  Cardiovascular: Normal rate and regular rhythm.   Neurological: He is alert and oriented to person, place, and time.  Skin: Skin is warm and dry.  Data Reviewed  Prior notes reviewed. Carotid doppler reveals stable plaque on the right side with an estimated stenosis of around 25%. Estimated stenosis on the left side has increased to approximately 50%. Plaque on left is more extensive and somewhat irregular. Assessment    Bilateral carotid stenosis with a moderate increase in plaque buildup on the left side since previous doppler on 09/23/15.      Plan       Follow up in 6 months with office carotid ultrasound.  This information has been scribed by Karie Fetch RN, BSN,BC.  Jeremy Higgins G 09/22/2016, 2:39 PM

## 2016-09-22 NOTE — Patient Instructions (Addendum)
The patient is aware to call back for any questions or concerns.   Follow up in 6 months with office carotid ultrasound..  Atherosclerosis Atherosclerosis is narrowing and hardening of the blood vessels (arteries). Arteries are tubes that carry blood that contains oxygen from the heart to all parts of the body. Arteries can become narrow or clogged with a buildup of fat, cholesterol, calcium, or other substances (plaque). Plaque decreases the amount of blood that can flow through the artery. Atherosclerosis can affect any artery in the body, including:  Heart arteries (coronary artery disease), which may cause heart attack.  Brain arteries, which may cause stroke.  Leg, arm, and pelvis arteries (peripheral artery disease), which may cause pain and numbness.  Kidney arteries, which may cause kidney (renal) failure. Treatment may slow the disease and prevent further damage to the heart, brain, peripheral arteries, and kidneys. What are the causes? Atherosclerosis develops when plaque forms in an artery. This damages the inside wall of the artery. Over time, the plaque grows and hardens. It may break through the artery wall. This causes a blood clot to form over the break, which narrows the artery more. The clot may also break loose and travel to other arteries, causing more damage. What increases the risk? This condition is more likely to develop in people who:  Are middle age or older.  Have a family history of atherosclerosis.  Have high cholesterol.  Have high blood fats (triglycerides).  Have diabetes.  Are overweight.  Smoke tobacco.  Do not exercise enough.  Have a substance in the blood that indicates increased levels of inflammation in the body (C-reactive protein, or CRP).  Have sleep apnea.  Are stressed.  Drink too much alcohol. What are the signs or symptoms? This condition may not cause any symptoms. If you do have symptoms, they are caused by damage to an area  of your body that is not getting enough blood. The following symptoms are possible:  Coronary artery disease may cause chest pain and shortness of breath.  Decreased blood supply to your brain may cause a stroke. Signs and symptoms of stroke may include sudden:  Weakness on one side of the body.  Confusion.  Changes in vision.  Inability to speak or understand speech.  Loss of balance, coordination, or ability to walk.  Severe headache.  Loss of consciousness.  Peripheral artery disease may cause pain and numbness, often in the legs and hips.  Renal failure may cause fatigue, nausea, swelling, and itchy skin. How is this diagnosed? This condition is diagnosed based on your medical history and a physical exam. During the exam, your health care provider will check your pulses and listen for a "whooshing" sound over your arteries (bruit). You may have tests, such as:  Blood tests to check your levels of cholesterol, triglycerides, and CRP.  Electrocardiogram (ECG) to check for heart damage.  Chest X-ray to see if your heart is enlarged, which is a sign of heart failure.  Stress test to see how your heart reacts to exercise.  Echocardiogram to get images of the inside of your heart.  Ankle-Brachial index to compare blood pressure in your arms to blood pressure in your ankles.  Ultrasound of your peripheral arteries to check blood flow.  CT scan to check for damage to your heart or brain.  X-rays of blood vessels after dye has been injected (angiogram) to check blood flow. How is this treated? Treatment starts with lifestyle changes, which may include:  Changing your diet.  Losing weight.  Reducing stress.  Exercising and being more physically active.  Not smoking. You also may need medicine to:  Lower triglycerides and cholesterol.  Lower and control blood pressure.  Prevent blood clots.  Lower inflammation in your body.  Lower and control your blood  sugar. Sometimes, surgery is needed to remove plaque, widen your artery, or create a new path for your blood (bypass). Surgical treatment may include:  Removing plaque from an artery (endarterectomy).  Opening a narrowed heart artery (angioplasty).  Heart or peripheral artery bypass graft surgery. Follow these instructions at home: Lifestyle    Eat a heart-healthy diet. Talk to your health care provider or a diet specialist (dietitian) if you need help. A heart-healthy diet includes:  Limiting unhealthy fats and increasing healthy fats. Some examples of healthy fats are olive oil and canola oil.  Eating plant-based foods, such as fruits, vegetables, nuts, legumes, and whole grains.  Follow an exercise program as told by your health care provider.  Maintain a healthy weight. Lose weight if directed by your health care provider.  Rest when you are tired.  Learn to manage your stress.  Do not use any tobacco products, such as cigarettes, chewing tobacco, and e-cigarettes. If you need help quitting, ask your health care provider.  Limit alcohol intake to no more than 1 drink a day for nonpregnant women and 2 drinks a day for men. One drink equals 12 oz of beer, 5 oz of wine, or 1 oz of hard liquor.  Do not abuse drugs. General instructions   Take over-the-counter and prescription medicines only as told by your health care provider.  Manage other health conditions as told by your health care provider.  Keep all follow-up visits as told by your health care provider. This is important. Contact a health care provider if:  You have chest pain or discomfort. This includes squeezing chest pain that may feel like indigestion (angina).  You have shortness of breath.  You have an irregular heartbeat.  You have unexplained fatigue.  You have unexplained pain or numbness in an arm, leg, or hip.  You have nausea, swelling of your hands or feet, and itchy skin. Get help right away  if:  You have symptoms of a heart attack, such as:  Chest pain.  Shortness of breath.  Pain in your neck, jaw, arms, back, or stomach.  Cold sweat.  Nausea.  Light-headedness.  You have symptoms of a stroke, such as sudden:  Weakness on one side of your body.  Confusion.  Changes in vision.  Inability to speak or understand speech.  Loss of balance, coordination, or ability to walk.  Severe headache.  Loss of consciousness. These symptoms may represent a serious problem that is an emergency. Do not wait to see if the symptoms will go away. Get medical help right away. Call your local emergency services (911 in the [place] Do not drive yourself to the hospital. This information is not intended to replace advice given to you by your health care provider. Make sure you discuss any questions you have with your health care provider. Document Released: 09/19/2003 Document Revised: 12/05/2015 Document Reviewed: 05/20/2015 Elsevier Interactive Patient Education  2017 [employer].

## 2016-11-09 ENCOUNTER — Encounter (INDEPENDENT_AMBULATORY_CARE_PROVIDER_SITE_OTHER): Payer: Medicare Other

## 2016-11-09 ENCOUNTER — Other Ambulatory Visit (INDEPENDENT_AMBULATORY_CARE_PROVIDER_SITE_OTHER): Payer: Medicare Other

## 2016-11-09 ENCOUNTER — Ambulatory Visit (INDEPENDENT_AMBULATORY_CARE_PROVIDER_SITE_OTHER): Payer: Medicare Other | Admitting: Vascular Surgery

## 2016-12-31 ENCOUNTER — Ambulatory Visit (INDEPENDENT_AMBULATORY_CARE_PROVIDER_SITE_OTHER): Payer: Medicare Other

## 2016-12-31 ENCOUNTER — Ambulatory Visit (INDEPENDENT_AMBULATORY_CARE_PROVIDER_SITE_OTHER): Payer: Medicare Other | Admitting: Vascular Surgery

## 2016-12-31 ENCOUNTER — Encounter (INDEPENDENT_AMBULATORY_CARE_PROVIDER_SITE_OTHER): Payer: Self-pay | Admitting: Vascular Surgery

## 2016-12-31 VITALS — BP 124/73 | HR 72 | Resp 15 | Ht 71.0 in | Wt 178.0 lb

## 2016-12-31 DIAGNOSIS — I10 Essential (primary) hypertension: Secondary | ICD-10-CM

## 2016-12-31 DIAGNOSIS — I6529 Occlusion and stenosis of unspecified carotid artery: Secondary | ICD-10-CM | POA: Diagnosis not present

## 2016-12-31 DIAGNOSIS — I714 Abdominal aortic aneurysm, without rupture, unspecified: Secondary | ICD-10-CM

## 2016-12-31 DIAGNOSIS — I70213 Atherosclerosis of native arteries of extremities with intermittent claudication, bilateral legs: Secondary | ICD-10-CM | POA: Diagnosis not present

## 2016-12-31 DIAGNOSIS — I872 Venous insufficiency (chronic) (peripheral): Secondary | ICD-10-CM

## 2016-12-31 NOTE — Progress Notes (Signed)
MRN : [id number]  Jeremy Higgins is a 82 y.o. ([birth year]) male who presents with chief complaint of  Chief Complaint  Patient presents with  . Carotid    6 month follow up u/s  .  History of Present Illness: The patient returns to the office for surveillance of an abdominal aortic aneurysm status post stent graft placement on 06/26/2015.   Patient denies abdominal pain or back pain, no other abdominal complaints. No groin related complaints. No symptoms consistent with distal embolization No changes in claudication distance.   There have been no interval changes in his overall healthcare since his last visit.   Patient denies amaurosis fugax or TIA symptoms. There is no history of claudication or rest pain symptoms of the lower extremities. The patient denies angina or shortness of breath.   Duplex US of the aorta and iliac arteries shows a 3.1 AAA sac with no endoleak, 1.1 cm decrease in the sac compared to the previous study.  Carotid Duplex shows 1-32% RICA and <44% LICA; high grade external carotid  stenosis bilaterally No outpatient prescriptions have been marked as taking for the 12/31/16 encounter (Office Visit) with Delana Meyer, Dolores Lory, MD.    Past Medical History:  Diagnosis Date  . Aneurysm ([place])   . Arthritis    right foot andankle   . Atrial fibrillation ([place]) 2013  . COPD (chronic obstructive pulmonary disease) ([place])   . Glaucoma   . Hypertension     Past Surgical History:  Procedure Laterality Date  . CATARACT EXTRACTION  2012  . COLONOSCOPY  2010   ? [place] MD  . FOOT SURGERY Right 1950's  . LEG SURGERY Right   . PERIPHERAL VASCULAR CATHETERIZATION N/A 06/18/2015   Procedure: Abdominal Aortogram w/Lower Extremity;  Surgeon: [name], MD;  Location: [facility];  Service: Cardiovascular;  Laterality: N/A;  . PERIPHERAL VASCULAR CATHETERIZATION  06/18/2015   Procedure: Lower Extremity Intervention;  Surgeon: [name], MD;  Location:  [facility];  Service: Cardiovascular;;  . PERIPHERAL VASCULAR CATHETERIZATION N/A 06/26/2015   Procedure: Endovascular Repair/Stent Graft;  Surgeon: [name], MD;  Location: [facility];  Service: Cardiovascular;  Laterality: N/A;  . SPINAL FUSION  2011    Social History Social History  Substance Use Topics  . Smoking status: Former Smoker    Years: 40.00    Quit date: 07/13/1998  . Smokeless tobacco: Never Used  . Alcohol use Yes     Comment: very rare    Family History Family History  Problem Relation Age of Onset  . Heart attack Father     No Known Allergies   REVIEW OF SYSTEMS (Negative unless checked)  Constitutional: [] Weight loss  [] Fever  [] Chills Cardiac: [] Chest pain   [] Chest pressure   [] Palpitations   [] Shortness of breath when laying flat   [] Shortness of breath with exertion. Vascular:  [x] Pain in legs with walking   [] Pain in legs at rest  [] History of DVT   [] Phlebitis   [] Swelling in legs   [] Varicose veins   [] Non-healing ulcers Pulmonary:   [] Uses home oxygen   [] Productive cough   [] Hemoptysis   [] Wheeze  [] COPD   [] Asthma Neurologic:  [] Dizziness   [] Seizures   [] History of stroke   [] History of TIA  [] Aphasia   [] Vissual changes   [] Weakness or numbness in arm   [] Weakness or numbness in leg Musculoskeletal:   [x] Joint swelling   [x] Joint pain   []   Low back pain Hematologic:  [x] Easy bruising  [] Easy bleeding   [] Hypercoagulable state   [] Anemic Gastrointestinal:  [] Diarrhea   [] Vomiting  [] Gastroesophageal reflux/heartburn   [] Difficulty swallowing. Genitourinary:  [] Chronic kidney disease   [] Difficult urination  [] Frequent urination   [] Blood in urine Skin:  [] Rashes   [] Ulcers  Psychological:  [] History of anxiety   []  History of major depression.  Physical Examination  Vitals:   12/31/16 0949 12/31/16 0950  BP: 136/78 124/73  Pulse: 79 72  Resp: 15   Weight: 178 lb (80.7 kg)   Height: 5\' 11"  (1.803 m)    Body mass  index is 24.83 kg/m. Gen: WD/WN, NAD Head: [place]/AT, No temporalis wasting.  Ear/Nose/Throat: Hearing grossly intact, nares w/o erythema or drainage Eyes: PER, EOMI, sclera nonicteric.  Neck: Supple, no large masses.   Pulmonary:  Good air movement, no audible wheezing bilaterally, no use of accessory muscles.  Cardiac: RRR, no JVD Vascular: bilateral carotid bruits Vessel Right Left  Radial Palpable Palpable  Ulnar Palpable Palpable  Brachial Palpable Palpable  Carotid Palpable Palpable  Femoral Palpable Palpable  Popliteal Not Palpable Not Palpable  PT Not Palpable Not Palpable  DP Not Palpable Not Palpable  Gastrointestinal: Non-distended. No guarding/no peritoneal signs.  Musculoskeletal: M/S 5/5 throughout.  No deformity or atrophy.  Neurologic: CN 2-12 intact. Symmetrical.  Speech is fluent. Motor exam as listed above. Psychiatric: Judgment intact, Mood & affect appropriate for pt's clinical situation. Dermatologic: No rashes or ulcers noted.  No changes consistent with cellulitis. Lymph : No lichenification or skin changes of chronic lymphedema.  CBC Lab Results  Component Value Date   WBC 7.9 07/03/2015   HGB 10.6 (L) 07/03/2015   HCT 32.8 (L) 07/03/2015   MCV 88.1 07/03/2015   PLT 260 07/03/2015    BMET    Component Value Date/Time   NA 140 07/01/2015 0456   K 3.9 07/01/2015 0456   CL 110 07/01/2015 0456   CO2 26 07/01/2015 0456   GLUCOSE 106 (H) 07/01/2015 0456   BUN 17 07/01/2015 0456   CREATININE 0.87 07/01/2015 0456   CALCIUM 7.8 (L) 07/01/2015 0456   GFRNONAA >60 07/01/2015 0456   GFRAA >60 07/01/2015 0456   CrCl cannot be calculated (Patient's most recent lab result is older than the maximum 21 days allowed.).  COAG Lab Results  Component Value Date   INR 1.23 06/30/2015   INR 1.46 06/29/2015   INR 1.08 06/18/2015    Radiology No results found.  Assessment/Plan 1. Abdominal aortic aneurysm (AAA) without rupture (HCC) Recommend: Patient is  status post successful endovascular repair of the AAA.   No further intervention is required at this time.   No endoleak is detected and the aneurysm sac is stable.  Duplex US of the aorta and iliac arteries shows a 3.1 AAA sac with no endoleak, 1.1 cm decrease in the sac compared to the previous study.  The patient will continue antiplatelet therapy as prescribed as well as aggressive management of hyperlipidemia. Exercise is again strongly encouraged.   However, endografts require continued surveillance with ultrasound or CT scan. This is mandatory to detect any changes that allow repressurization of the aneurysm sac.  The patient is informed that this would be asymptomatic.  The patient is reminded that lifelong routine surveillance is a necessity with an endograft. Patient will continue to follow-up at 6 month intervals with ultrasound of the aorta.  2. Atherosclerosis of native artery of both lower extremities with intermittent claudication ([place])  Recommend:  The patient has evidence of atherosclerosis of the lower extremities with claudication.  The patient does not voice lifestyle limiting changes at this point in time.  Noninvasive studies do not suggest clinically significant change. ABI's Rt=0.59 and Lt=0.68 (previous study was Rt=0.59 and Lt=0.72)  No invasive studies, angiography or surgery at this time The patient should continue walking and begin a more formal exercise program.  The patient should continue antiplatelet therapy and aggressive treatment of the lipid abnormalities  No changes in the patient's medications at this time  The patient should continue wearing graduated compression socks 10-15 mmHg strength to control the mild edema.    3. Stenosis of carotid artery, unspecified laterality Recommend:  Given the patient's asymptomatic subcritical stenosis no further invasive testing or surgery at this time.  Carotid Duplex shows 1-58% RICA and <30% LICA; high  grade external carotid  stenosis bilaterally  Continue antiplatelet therapy as prescribed Continue management of CAD, HTN and Hyperlipidemia Healthy heart diet,  encouraged exercise at least 4 times per week Follow up in 12 months with duplex ultrasound and physical exam based on <50% stenosis of the LICA carotid artery   4. Chronic venous insufficiency No surgery or intervention at this point in time.    I have had a long discussion with the patient regarding venous insufficiency and why it  causes symptoms. I have discussed with the patient the chronic skin changes that accompany venous insufficiency and the long term sequela such as infection and ulceration.  Patient will begin wearing graduated compression stockings class 1 (20-30 mmHg) or compression wraps on a daily basis a prescription was given. The patient will put the stockings on first thing in the morning and removing them in the evening. The patient is instructed specifically not to sleep in the stockings.    In addition, behavioral modification including several periods of elevation of the lower extremities during the day will be continued. I have demonstrated that proper elevation is a position with the ankles at heart level.  The patient is instructed to begin routine exercise, especially walking on a daily basis  Following the review of the ultrasound the patient will follow up in 2-3 months to reassess the degree of swelling and the control that graduated compression stockings or compression wraps  is offering.   The patient can be assessed for a Lymph Pump at that time  5. Essential hypertension Continue antihypertensive medications as already ordered, these medications have been reviewed and there are no changes at this time.     Hortencia Pilar, MD  12/31/2016 10:43 AM

## 2017-03-22 ENCOUNTER — Encounter: Payer: Self-pay | Admitting: General Surgery

## 2017-03-22 ENCOUNTER — Ambulatory Visit (INDEPENDENT_AMBULATORY_CARE_PROVIDER_SITE_OTHER): Payer: Medicare Other | Admitting: General Surgery

## 2017-03-22 ENCOUNTER — Other Ambulatory Visit: Payer: Self-pay

## 2017-03-22 DIAGNOSIS — I6529 Occlusion and stenosis of unspecified carotid artery: Secondary | ICD-10-CM

## 2017-03-22 NOTE — Patient Instructions (Signed)
Continue to follow up with Dr. Delana Meyer for monitoring.

## 2017-03-22 NOTE — Progress Notes (Signed)
Patient ID: Jeremy Higgins, male   DOB: 09/17/1934, 82 y.o.   MRN: [id number]  Chief Complaint  Patient presents with  . Follow-up    carotid    HPI Jeremy Higgins is a 82 y.o. male is here today for a 6 month carotid ultrasound follow up. Patient states he is doing well. No new changes. He was last seen by Dr. Delana Meyer who did a carotid US on 12/31/16.  HPI  Past Medical History:  Diagnosis Date  . Aneurysm ([place])   . Arthritis    right foot andankle   . Atrial fibrillation ([place]) 2013  . COPD (chronic obstructive pulmonary disease) ([place])   . Glaucoma   . Hypertension     Past Surgical History:  Procedure Laterality Date  . CATARACT EXTRACTION  2012  . COLONOSCOPY  2010   ? [place] MD  . FOOT SURGERY Right 1950's  . LEG SURGERY Right   . PERIPHERAL VASCULAR CATHETERIZATION N/A 06/18/2015   Procedure: Abdominal Aortogram w/Lower Extremity;  Surgeon: [name], MD;  Location: [facility];  Service: Cardiovascular;  Laterality: N/A;  . PERIPHERAL VASCULAR CATHETERIZATION  06/18/2015   Procedure: Lower Extremity Intervention;  Surgeon: [name], MD;  Location: [facility];  Service: Cardiovascular;;  . PERIPHERAL VASCULAR CATHETERIZATION N/A 06/26/2015   Procedure: Endovascular Repair/Stent Graft;  Surgeon: [name], MD;  Location: [facility];  Service: Cardiovascular;  Laterality: N/A;  . SPINAL FUSION  2011    Family History  Problem Relation Age of Onset  . Heart attack Father     Social History Social History  Substance Use Topics  . Smoking status: Former Smoker    Years: 40.00    Quit date: 07/13/1998  . Smokeless tobacco: Never Used  . Alcohol use Yes     Comment: very rare    No Known Allergies  Current Outpatient Prescriptions  Medication Sig Dispense Refill  . diltiazem (CARTIA XT) 180 MG 24 hr capsule take 1 capsule by mouth once daily    . ELIQUIS 5 MG TABS tablet Take 5 mg by mouth 2 (two) times daily.      . folic acid (FOLVITE) 1 MG tablet Take 1 mg by mouth daily.    Marland Kitchen latanoprost (XALATAN) 0.005 % ophthalmic solution Apply 1 drop to eye at bedtime.    Marland Kitchen lisinopril (PRINIVIL,ZESTRIL) 10 MG tablet Take 5 mg by mouth daily. Reported on 06/26/2015    . lovastatin (MEVACOR) 40 MG tablet Take 40 mg by mouth at bedtime.    . metoprolol tartrate (LOPRESSOR) 25 MG tablet Take 25 mg by mouth as needed.     . vitamin C (ASCORBIC ACID) 500 MG tablet Take 500 mg by mouth daily.     No current facility-administered medications for this visit.     Review of Systems Review of Systems  Constitutional: Negative.   Respiratory: Negative.   Cardiovascular: Negative.     Blood pressure 138/68, pulse 68, resp. rate 14, height 5\' 10"  (1.778 m), weight 181 lb (82.1 kg).  Physical Exam Physical Exam  Constitutional: He is oriented to person, place, and time. He appears well-developed and well-nourished.  Eyes: Conjunctivae are normal. No scleral icterus.  Neurological: He is alert and oriented to person, place, and time.    Data Reviewed Prior notes and carotid US results reviewed.   Assessment    Carotid stenosis     Plan    Given that carotid  US was done 3 months ago by Dr. Delana Meyer, there is no need for a repeat US today. Patient already has an appointment with Dr. Delana Meyer for next year and should continue to follow up with him for monitoring.   HPI, Physical Exam, Assessment and Plan have been scribed under the direction and in the presence of Mckinley Jewel, MD.  Verlene Mayer, CMA      I have completed the exam and reviewed the above documentation for accuracy and completeness.  I agree with the above.  Haematologist has been used and any errors in dictation or transcription are unintentional.  Seeplaputhur G. Jamal Collin, M.D., F.A.C.S.  Junie Panning G 03/22/2017, 10:58 AM

## 2018-01-03 ENCOUNTER — Encounter (INDEPENDENT_AMBULATORY_CARE_PROVIDER_SITE_OTHER): Payer: Medicare Other

## 2018-01-03 ENCOUNTER — Ambulatory Visit (INDEPENDENT_AMBULATORY_CARE_PROVIDER_SITE_OTHER): Payer: Medicare Other | Admitting: Vascular Surgery

## 2018-02-10 ENCOUNTER — Encounter (INDEPENDENT_AMBULATORY_CARE_PROVIDER_SITE_OTHER): Payer: Self-pay | Admitting: Vascular Surgery

## 2018-02-10 ENCOUNTER — Ambulatory Visit (INDEPENDENT_AMBULATORY_CARE_PROVIDER_SITE_OTHER): Payer: Medicare Other

## 2018-02-10 ENCOUNTER — Ambulatory Visit (INDEPENDENT_AMBULATORY_CARE_PROVIDER_SITE_OTHER): Payer: Medicare Other | Admitting: Vascular Surgery

## 2018-02-10 VITALS — BP 126/75 | HR 79 | Resp 16 | Ht 71.0 in | Wt 180.0 lb

## 2018-02-10 DIAGNOSIS — I714 Abdominal aortic aneurysm, without rupture, unspecified: Secondary | ICD-10-CM

## 2018-02-10 DIAGNOSIS — I70213 Atherosclerosis of native arteries of extremities with intermittent claudication, bilateral legs: Secondary | ICD-10-CM | POA: Diagnosis not present

## 2018-02-10 DIAGNOSIS — I6529 Occlusion and stenosis of unspecified carotid artery: Secondary | ICD-10-CM | POA: Diagnosis not present

## 2018-02-10 DIAGNOSIS — I872 Venous insufficiency (chronic) (peripheral): Secondary | ICD-10-CM

## 2018-02-10 DIAGNOSIS — I10 Essential (primary) hypertension: Secondary | ICD-10-CM

## 2018-02-11 ENCOUNTER — Encounter (INDEPENDENT_AMBULATORY_CARE_PROVIDER_SITE_OTHER): Payer: Self-pay | Admitting: Vascular Surgery

## 2018-02-11 NOTE — Progress Notes (Signed)
MRN : [id number]  Jeremy Higgins is a 83 y.o. (26-May-1935) male who presents with chief complaint of  Chief Complaint  Patient presents with  . Follow-up    ultrasound follow up  .  History of Present Illness:   The patient returns to the office for surveillance of an abdominal aortic aneurysm status post stent graft placement on 06/26/2015.   Patient denies abdominal pain or back pain, no other abdominal complaints. No groin related complaints. No symptoms consistent with distal embolization No changes in claudication distance.   There have been no interval changes in his overall healthcare since his last visit.   Patient denies amaurosis fugax or TIA symptoms. There is no history of claudication or rest pain symptoms of the lower extremities. The patient denies angina or shortness of breath.   Duplex US of the aorta and iliac arteries shows a 3.7 AAA sac with no endoleak, 6 mm increase in the sac compared to the previous study.  ABI Rt=0.69 and Lt=0.66 (previous ABI Rt=0.59 and Lt=0.68)  Carotid Duplex shows 2-33% RICA and <00% LICA; high grade external carotid  stenosis bilaterally.  No change compared to previous study dated December 31, 2016.    Current Meds  Medication Sig  . diltiazem (CARTIA XT) 180 MG 24 hr capsule take 1 capsule by mouth once daily  . ELIQUIS 5 MG TABS tablet Take 5 mg by mouth 2 (two) times daily.   . folic acid (FOLVITE) 1 MG tablet Take 1 mg by mouth daily.  Marland Kitchen latanoprost (XALATAN) 0.005 % ophthalmic solution Apply 1 drop to eye at bedtime.  Marland Kitchen lisinopril (PRINIVIL,ZESTRIL) 10 MG tablet Take 5 mg by mouth daily. Reported on 06/26/2015  . lovastatin (MEVACOR) 40 MG tablet Take 40 mg by mouth at bedtime.  . metoprolol tartrate (LOPRESSOR) 25 MG tablet Take 25 mg by mouth as needed.   . vitamin C (ASCORBIC ACID) 500 MG tablet Take 500 mg by mouth daily.    Past Medical History:  Diagnosis Date  . Aneurysm ([place])   . Arthritis    right foot  andankle   . Atrial fibrillation ([place]) 2013  . COPD (chronic obstructive pulmonary disease) ([place])   . Glaucoma   . Hypertension     Past Surgical History:  Procedure Laterality Date  . CATARACT EXTRACTION  2012  . COLONOSCOPY  2010   ? [place] MD  . FOOT SURGERY Right 1950's  . LEG SURGERY Right   . PERIPHERAL VASCULAR CATHETERIZATION N/A 06/18/2015   Procedure: Abdominal Aortogram w/Lower Extremity;  Surgeon: [name], MD;  Location: [facility] [facility];  Service: Cardiovascular;  Laterality: N/A;  . PERIPHERAL VASCULAR CATHETERIZATION  06/18/2015   Procedure: Lower Extremity Intervention;  Surgeon: [name], MD;  Location: [place];  Service: Cardiovascular;;  . PERIPHERAL VASCULAR CATHETERIZATION N/A 06/26/2015   Procedure: Endovascular Repair/Stent Graft;  Surgeon: [name], MD;  Location: [facility];  Service: Cardiovascular;  Laterality: N/A;  . SPINAL FUSION  2011    Social History Social History   Tobacco Use  . Smoking status: Former Smoker    Years: 40.00    Last attempt to quit: 07/13/1998    Years since quitting: 19.5  . Smokeless tobacco: Never Used  Substance Use Topics  . Alcohol use: Yes    Comment: very rare  . Drug use: No    Family History Family History  Problem Relation Age of Onset  .  Heart attack Father     No Known Allergies   REVIEW OF SYSTEMS (Negative unless checked)  Constitutional: [] Weight loss  [] Fever  [] Chills Cardiac: [] Chest pain   [] Chest pressure   [] Palpitations   [] Shortness of breath when laying flat   [] Shortness of breath with exertion. Vascular:  [] Pain in legs with walking   [] Pain in legs at rest  [] History of DVT   [] Phlebitis   [] Swelling in legs   [] Varicose veins   [] Non-healing ulcers Pulmonary:   [] Uses home oxygen   [] Productive cough   [] Hemoptysis   [] Wheeze  [] COPD   [] Asthma Neurologic:  [] Dizziness   [] Seizures   [] History of stroke   [] History of TIA  [] Aphasia    [] Vissual changes   [] Weakness or numbness in arm   [] Weakness or numbness in leg Musculoskeletal:   [] Joint swelling   [] Joint pain   [] Low back pain Hematologic:  [] Easy bruising  [] Easy bleeding   [] Hypercoagulable state   [] Anemic Gastrointestinal:  [] Diarrhea   [] Vomiting  [] Gastroesophageal reflux/heartburn   [] Difficulty swallowing. Genitourinary:  [] Chronic kidney disease   [] Difficult urination  [] Frequent urination   [] Blood in urine Skin:  [] Rashes   [] Ulcers  Psychological:  [] History of anxiety   []  History of major depression.  Physical Examination  Vitals:   02/10/18 0910  BP: 126/75  Pulse: 79  Resp: 16  Weight: 180 lb (81.6 kg)  Height: 5\' 11"  (1.803 m)   Body mass index is 25.1 kg/m. Gen: WD/WN, NAD Head: [place]/AT, No temporalis wasting.  Ear/Nose/Throat: Hearing grossly intact, nares w/o erythema or drainage Eyes: PER, EOMI, sclera nonicteric.  Neck: Supple, no large masses.   Pulmonary:  Good air movement, no audible wheezing bilaterally, no use of accessory muscles.  Cardiac: RRR, no JVD Vascular: Bilateral carotid bruits Vessel Right Left  Radial Palpable Palpable  Brachial Palpable Palpable  Carotid Palpable Palpable  PT  not palpable  not palpable  DP  not palpable  not palpable  Gastrointestinal: Non-distended. No guarding/no peritoneal signs.  Musculoskeletal: M/S 5/5 throughout.  No deformity or atrophy.  Neurologic: CN 2-12 intact. Symmetrical.  Speech is fluent. Motor exam as listed above. Psychiatric: Judgment intact, Mood & affect appropriate for pt's clinical situation. Dermatologic: Mild venous rashes no ulcers noted.  No changes consistent with cellulitis. Lymph : No lichenification or skin changes of chronic lymphedema.  CBC Lab Results  Component Value Date   WBC 7.9 07/03/2015   HGB 10.6 (L) 07/03/2015   HCT 32.8 (L) 07/03/2015   MCV 88.1 07/03/2015   PLT 260 07/03/2015    BMET    Component Value Date/Time   NA 140 07/01/2015  0456   K 3.9 07/01/2015 0456   CL 110 07/01/2015 0456   CO2 26 07/01/2015 0456   GLUCOSE 106 (H) 07/01/2015 0456   BUN 17 07/01/2015 0456   CREATININE 0.87 07/01/2015 0456   CALCIUM 7.8 (L) 07/01/2015 0456   GFRNONAA >60 07/01/2015 0456   GFRAA >60 07/01/2015 0456   CrCl cannot be calculated (Patient's most recent lab result is older than the maximum 21 days allowed.).  COAG Lab Results  Component Value Date   INR 1.23 06/30/2015   INR 1.46 06/29/2015   INR 1.08 06/18/2015    Radiology No results found.  Assessment/Plan 1. Abdominal aortic aneurysm (AAA) without rupture (HCC) Recommend: Patient is status post successful endovascular repair of the AAA.   No further intervention is required at this time.   No  endoleak is detected and the aneurysm sac is stable.  Duplex US of the aorta and iliac arteries shows a 3.7 AAA sac with no endoleak, 6 mm increase in the sac compared to the previous study.  The patient will continue antiplatelet therapy as prescribed as well as aggressive management of hyperlipidemia. Exercise is again strongly encouraged.   However, endografts require continued surveillance with ultrasound or CT scan. This is mandatory to detect any changes that allow repressurization of the aneurysm sac.  The patient is informed that this would be asymptomatic.  The patient is reminded that lifelong routine surveillance is a necessity with an endograft. Patient will continue to follow-up at 12 month intervals with ultrasound of the aorta.  - VAS US AORTA/IVC/ILIACS; Future  2. Atherosclerosis of native artery of both lower extremities with intermittent claudication (HCC) Recommend:  The patient has evidence of atherosclerosis of the lower extremities with claudication.  The patient does not voice lifestyle limiting changes at this point in time.  Noninvasive studies do not suggest clinically significant change. ABI's Rt=0.0.69 and Lt=0.66 (previous study  was  Rt=0.59 and Lt=0.68)  No invasive studies, angiography or surgery at this time The patient should continue walking and begin a more formal exercise program.  The patient should continue antiplatelet therapy and aggressive treatment of the lipid abnormalities  No changes in the patient's medications at this time  The patient should continue wearing graduated compression socks 10-15 mmHg strength to control the mild edema.   - VAS [place] ABI WITH/WO TBI; Future  3. Stenosis of carotid artery, unspecified laterality Recommend:  Given the patient's asymptomatic subcritical stenosis no further invasive testing or surgery at this time.  Carotid Duplex shows 4-08% RICA and <14% LICA; high grade external carotid  stenosis bilaterally  Continue antiplatelet therapy as prescribed Continue management of CAD, HTN and Hyperlipidemia Healthy heart diet,  encouraged exercise at least 4 times per week  Follow up at 2 year intervals with duplex ultrasound and physical exam based on <50% stenosis of the LICA carotid artery   4. Chronic venous insufficiency No surgery or intervention at this point in time.    I have had a long discussion with the patient regarding venous insufficiency and why it  causes symptoms. I have discussed with the patient the chronic skin changes that accompany venous insufficiency and the long term sequela such as infection and ulceration.  Patient will begin wearing graduated compression stockings class 1 (20-30 mmHg) or compression wraps on a daily basis a prescription was given. The patient will put the stockings on first thing in the morning and removing them in the evening. The patient is instructed specifically not to sleep in the stockings.    In addition, behavioral modification including several periods of elevation of the lower extremities during the day will be continued. I have demonstrated that proper elevation is a position with the ankles at heart  level.  The patient is instructed to begin routine exercise, especially walking on a daily basis  Following the review of the ultrasound the patient will follow as ordered and can be reassessed as to the degree of swelling and the control that graduated compression stockings or compression wraps  is offering.   The patient can be assessed for a Lymph Pump at that time    5. Essential hypertension Continue antihypertensive medications as already ordered, these medications have been reviewed and there are no changes at this time.      Hortencia Pilar, MD  02/11/2018 3:00 PM

## 2019-04-12 NOTE — Progress Notes (Signed)
MRN : [id number]  Jeremy Higgins is a 84 y.o. (1935-06-29) male who presents with chief complaint of No chief complaint on file. Marland Kitchen  History of Present Illness:  The patient returns to the office for surveillance of an abdominal aortic aneurysm status post stent graft placement on12/14/2016.   Patient denies abdominal pain or back pain, no other abdominal complaints. No groin related complaints. No symptoms consistent with distal embolization No changes in claudication distance.   There have been no interval changes in his overall healthcare since his last visit.   Patient denies amaurosis fugax or TIA symptoms. There is no history of claudication or rest pain symptoms of the lower extremities. The patient denies angina or shortness of breath.   Duplex US of the aorta and iliac arteries shows a4.20AAA sac with noendoleak, 5 mm increasein the sac compared to the previous study.  ABI Rt=0.74 and Lt=0.76 (previous ABI Rt=0.69 and Lt=0.66)  Previous Carotid Duplex shows 123456 RICA and [id number] LICA; high grade external carotid stenosis bilaterally.  No change compared to previous study dated December 31, 2016.  No outpatient medications have been marked as taking for the 04/13/19 encounter (Appointment) with Delana Meyer, Dolores Lory, MD.    Past Medical History:  Diagnosis Date  . Aneurysm ([place])   . Arthritis    right foot andankle   . Atrial fibrillation ([place]) 2013  . COPD (chronic obstructive pulmonary disease) ([place])   . Glaucoma   . Hypertension     Past Surgical History:  Procedure Laterality Date  . CATARACT EXTRACTION  2012  . COLONOSCOPY  2010   ? [place] MD  . FOOT SURGERY Right 1950's  . LEG SURGERY Right   . PERIPHERAL VASCULAR CATHETERIZATION N/A 06/18/2015   Procedure: Abdominal Aortogram w/Lower Extremity;  Surgeon: [name], MD;  Location: [facility];  Service: Cardiovascular;  Laterality: N/A;  . PERIPHERAL VASCULAR CATHETERIZATION  06/18/2015   Procedure: Lower Extremity Intervention;  Surgeon: [name], MD;  Location: [facility];  Service: Cardiovascular;;  . PERIPHERAL VASCULAR CATHETERIZATION N/A 06/26/2015   Procedure: Endovascular Repair/Stent Graft;  Surgeon: [name], MD;  Location: [facility];  Service: Cardiovascular;  Laterality: N/A;  . SPINAL FUSION  2011    Social History Social History   Tobacco Use  . Smoking status: Former Smoker    Years: 40.00    Quit date: 07/13/1998    Years since quitting: 20.7  . Smokeless tobacco: Never Used  Substance Use Topics  . Alcohol use: Yes    Comment: very rare  . Drug use: No    Family History Family History  Problem Relation Age of Onset  . Heart attack Father     No Known Allergies   REVIEW OF SYSTEMS (Negative unless checked)  Constitutional: [] Weight loss  [] Fever  [] Chills Cardiac: [] Chest pain   [] Chest pressure   [] Palpitations   [] Shortness of breath when laying flat   [] Shortness of breath with exertion. Vascular:  [] Pain in legs with walking   [] Pain in legs at rest  [] History of DVT   [] Phlebitis   [] Swelling in legs   [] Varicose veins   [] Non-healing ulcers Pulmonary:   [] Uses home oxygen   [] Productive cough   [] Hemoptysis   [] Wheeze  [] COPD   [] Asthma Neurologic:  [] Dizziness   [] Seizures   [] History of stroke   [] History of TIA  [] Aphasia   [] Vissual changes   [] Weakness or numbness in  arm   [] Weakness or numbness in leg Musculoskeletal:   [] Joint swelling   [] Joint pain   [] Low back pain Hematologic:  [] Easy bruising  [] Easy bleeding   [] Hypercoagulable state   [] Anemic Gastrointestinal:  [] Diarrhea   [] Vomiting  [] Gastroesophageal reflux/heartburn   [] Difficulty swallowing. Genitourinary:  [] Chronic kidney disease   [] Difficult urination  [] Frequent urination   [] Blood in urine Skin:  [] Rashes   [] Ulcers  Psychological:  [] History of anxiety   []  History of major depression.  Physical Examination  There were  no vitals filed for this visit. There is no height or weight on file to calculate BMI. Gen: WD/WN, NAD Head: [place]/AT, No temporalis wasting.  Ear/Nose/Throat: Hearing grossly intact, nares w/o erythema or drainage Eyes: PER, EOMI, sclera nonicteric.  Neck: Supple, no large masses.   Pulmonary:  Good air movement, no audible wheezing bilaterally, no use of accessory muscles.  Cardiac: RRR, no JVD Vascular: left carotid bruit noted Vessel Right Left  Radial Palpable Palpable  PT Not Palpable Not Palpable  DP Not Palpable Not Palpable  Gastrointestinal: Non-distended. No guarding/no peritoneal signs.  Musculoskeletal: M/S 5/5 throughout.  No deformity or atrophy.  Neurologic: CN 2-12 intact. Symmetrical.  Speech is fluent. Motor exam as listed above. Psychiatric: Judgment intact, Mood & affect appropriate for pt's clinical situation. Dermatologic: No rashes or ulcers noted.  No changes consistent with cellulitis. Lymph : No lichenification or skin changes of chronic lymphedema.  CBC Lab Results  Component Value Date   WBC 7.9 07/03/2015   HGB 10.6 (L) 07/03/2015   HCT 32.8 (L) 07/03/2015   MCV 88.1 07/03/2015   PLT 260 07/03/2015    BMET    Component Value Date/Time   NA 140 07/01/2015 0456   K 3.9 07/01/2015 0456   CL 110 07/01/2015 0456   CO2 26 07/01/2015 0456   GLUCOSE 106 (H) 07/01/2015 0456   BUN 17 07/01/2015 0456   CREATININE 0.87 07/01/2015 0456   CALCIUM 7.8 (L) 07/01/2015 0456   GFRNONAA >60 07/01/2015 0456   GFRAA >60 07/01/2015 0456   CrCl cannot be calculated (Patient's most recent lab result is older than the maximum 21 days allowed.).  COAG Lab Results  Component Value Date   INR 1.23 06/30/2015   INR 1.46 06/29/2015   INR 1.08 06/18/2015    Radiology No results found.  Assessment/Plan 1. Abdominal aortic aneurysm (AAA) without rupture (HCC) Recommend: Patient is status post successful endovascular repair of the AAA.   No further intervention  is required at this time.   No endoleak is detected and the aneurysm sac is stable.  The patient will continue antiplatelet therapy as prescribed as well as aggressive management of hyperlipidemia. Exercise is again strongly encouraged.   However, endografts require continued surveillance with ultrasound or CT scan. This is mandatory to detect any changes that allow repressurization of the aneurysm sac.  The patient is informed that this would be asymptomatic.  The patient is reminded that lifelong routine surveillance is a necessity with an endograft. Patient will continue to follow-up at 6 month intervals with ultrasound of the aorta. - VAS US AORTA/IVC/ILIACS; Future  2. Atherosclerosis of native artery of both lower extremities with intermittent claudication (HCC)  Recommend:  The patient has evidence of atherosclerosis of the lower extremities with claudication.  The patient does not voice lifestyle limiting changes at this point in time.  Noninvasive studies do not suggest clinically significant change.  No invasive studies, angiography or surgery at this  time The patient should continue walking and begin a more formal exercise program.  The patient should continue antiplatelet therapy and aggressive treatment of the lipid abnormalities  No changes in the patient's medications at this time  The patient should continue wearing graduated compression socks 10-15 mmHg strength to control the mild edema.   - VAS [place] ABI WITH/WO TBI; Future  3. Stenosis of carotid artery, unspecified laterality Recommend:  Given the patient's asymptomatic subcritical stenosis no further invasive testing or surgery at this time.  Duplex ultrasound shows <50% stenosis bilaterally.  Continue antiplatelet therapy as prescribed Continue management of CAD, HTN and Hyperlipidemia Healthy heart diet,  encouraged exercise at least 4 times per week Follow up in 24 months with duplex ultrasound and physical  exam   4. Essential hypertension Continue antihypertensive medications as already ordered, these medications have been reviewed and there are no changes at this time.   5. Chronic venous insufficiency No surgery or intervention at this point in time.    I have reviewed my discussion with the patient regarding venous insufficiency and secondary lymph edema and why it  causes symptoms. I have discussed with the patient the chronic skin changes that accompany these problems and the long term sequela such as ulceration and infection.  Patient will continue wearing graduated compression stockings class 1 (20-30 mmHg) on a daily basis a prescription was given to the patient to keep this updated. The patient will  put the stockings on first thing in the morning and removing them in the evening. The patient is instructed specifically not to sleep in the stockings.  In addition, behavioral modification including elevation during the day will be continued.  Diet and salt restriction was also discussed.  Previous duplex ultrasound of the lower extremities shows normal deep venous system, superficial reflux was not present.   Following the review of the ultrasound the patient will follow up in 12 months to reassess the degree of swelling and the control that graduated compression is offering.   The patient can be assessed for a Lymph Pump at that time.  However, at this time the patient states they are satisfied with the control compression and elevation is yielding.     Hortencia Pilar, MD  04/12/2019 7:49 PM

## 2019-04-13 ENCOUNTER — Encounter (INDEPENDENT_AMBULATORY_CARE_PROVIDER_SITE_OTHER): Payer: Self-pay | Admitting: Vascular Surgery

## 2019-04-13 ENCOUNTER — Ambulatory Visit (INDEPENDENT_AMBULATORY_CARE_PROVIDER_SITE_OTHER): Payer: Medicare Other

## 2019-04-13 ENCOUNTER — Ambulatory Visit (INDEPENDENT_AMBULATORY_CARE_PROVIDER_SITE_OTHER): Payer: Medicare Other | Admitting: Vascular Surgery

## 2019-04-13 ENCOUNTER — Other Ambulatory Visit: Payer: Self-pay

## 2019-04-13 VITALS — BP 154/63 | HR 89 | Resp 19 | Ht 71.0 in | Wt 188.0 lb

## 2019-04-13 DIAGNOSIS — I6529 Occlusion and stenosis of unspecified carotid artery: Secondary | ICD-10-CM | POA: Diagnosis not present

## 2019-04-13 DIAGNOSIS — I714 Abdominal aortic aneurysm, without rupture, unspecified: Secondary | ICD-10-CM

## 2019-04-13 DIAGNOSIS — I70213 Atherosclerosis of native arteries of extremities with intermittent claudication, bilateral legs: Secondary | ICD-10-CM | POA: Diagnosis not present

## 2019-04-13 DIAGNOSIS — I10 Essential (primary) hypertension: Secondary | ICD-10-CM

## 2019-04-13 DIAGNOSIS — I872 Venous insufficiency (chronic) (peripheral): Secondary | ICD-10-CM

## 2019-04-18 DIAGNOSIS — I739 Peripheral vascular disease, unspecified: Secondary | ICD-10-CM | POA: Insufficient documentation

## 2020-04-15 ENCOUNTER — Other Ambulatory Visit: Payer: Self-pay

## 2020-04-15 ENCOUNTER — Encounter (INDEPENDENT_AMBULATORY_CARE_PROVIDER_SITE_OTHER): Payer: Self-pay | Admitting: Vascular Surgery

## 2020-04-15 ENCOUNTER — Ambulatory Visit (INDEPENDENT_AMBULATORY_CARE_PROVIDER_SITE_OTHER): Payer: Medicare Other

## 2020-04-15 ENCOUNTER — Ambulatory Visit (INDEPENDENT_AMBULATORY_CARE_PROVIDER_SITE_OTHER): Payer: Medicare Other | Admitting: Vascular Surgery

## 2020-04-15 VITALS — BP 138/77 | HR 83 | Resp 16 | Wt 184.6 lb

## 2020-04-15 DIAGNOSIS — I70213 Atherosclerosis of native arteries of extremities with intermittent claudication, bilateral legs: Secondary | ICD-10-CM | POA: Diagnosis not present

## 2020-04-15 DIAGNOSIS — I714 Abdominal aortic aneurysm, without rupture, unspecified: Secondary | ICD-10-CM

## 2020-04-15 DIAGNOSIS — I2510 Atherosclerotic heart disease of native coronary artery without angina pectoris: Secondary | ICD-10-CM | POA: Insufficient documentation

## 2020-04-15 DIAGNOSIS — I6523 Occlusion and stenosis of bilateral carotid arteries: Secondary | ICD-10-CM

## 2020-04-15 DIAGNOSIS — I739 Peripheral vascular disease, unspecified: Secondary | ICD-10-CM | POA: Diagnosis not present

## 2020-04-15 DIAGNOSIS — I25118 Atherosclerotic heart disease of native coronary artery with other forms of angina pectoris: Secondary | ICD-10-CM

## 2020-04-15 NOTE — Progress Notes (Signed)
MRN : [id number]  Jeremy Higgins is a 85 y.o. (05-Feb-1935) male who presents with chief complaint of  Chief Complaint  Patient presents with  . Follow-up    ultrasound follow up  .  History of Present Illness:   The patient returns to the office for surveillance of an abdominal aortic aneurysm status post stent graft placement on12/14/2016.   Patient denies abdominal pain or back pain, no other abdominal complaints. No groin related complaints. No symptoms consistent with distal embolization No changes in claudication distance.   There have been no interval changes in his overall healthcare since his last visit.   Patient denies amaurosis fugax or TIA symptoms. There is no history of claudication or rest pain symptoms of the lower extremities. The patient denies angina or shortness of breath.   Duplex US of the aorta and iliac arteries shows a4.20AAA sac with noendoleak, no changein the sac compared to the previous study.  ABI Rt=0.67 and Lt=0.71 (previous ABI Rt=0.74 and Lt=0.76)  Previous Carotid Duplex shows 6-43% RICA and <32% LICA; high grade external carotid stenosis bilaterally.No change compared to previous study dated December 31, 2016.  Current Meds  Medication Sig  . cetirizine (ZYRTEC) 10 MG tablet TAKE 1 TABLET(10 MG) BY MOUTH EVERY DAY  . diltiazem (CARTIA XT) 180 MG 24 hr capsule take 1 capsule by mouth once daily  . ELIQUIS 5 MG TABS tablet Take 5 mg by mouth 2 (two) times daily.   . folic acid (FOLVITE) 1 MG tablet Take 1 mg by mouth daily.  Marland Kitchen latanoprost (XALATAN) 0.005 % ophthalmic solution Apply 1 drop to eye at bedtime.  Marland Kitchen lisinopril (PRINIVIL,ZESTRIL) 10 MG tablet Take 5 mg by mouth daily. Reported on 06/26/2015  . lovastatin (MEVACOR) 40 MG tablet Take 40 mg by mouth at bedtime.  . metoprolol tartrate (LOPRESSOR) 25 MG tablet Take 25 mg by mouth as needed.   . mupirocin ointment (BACTROBAN) 2 % Apply topically.  . sildenafil (VIAGRA) 100 MG  tablet Take by mouth.  . vitamin C (ASCORBIC ACID) 500 MG tablet Take 500 mg by mouth daily.    Past Medical History:  Diagnosis Date  . Aneurysm ([place])   . Arthritis    right foot andankle   . Atrial fibrillation ([place]) 2013  . COPD (chronic obstructive pulmonary disease) ([place])   . Glaucoma   . Hypertension     Past Surgical History:  Procedure Laterality Date  . CATARACT EXTRACTION  2012  . COLONOSCOPY  2010   ? [place] MD  . FOOT SURGERY Right 1950's  . LEG SURGERY Right   . PERIPHERAL VASCULAR CATHETERIZATION N/A 06/18/2015   Procedure: Abdominal Aortogram w/Lower Extremity;  Surgeon: [name], MD;  Location: [facility];  Service: Cardiovascular;  Laterality: N/A;  . PERIPHERAL VASCULAR CATHETERIZATION  06/18/2015   Procedure: Lower Extremity Intervention;  Surgeon: [name], MD;  Location: [facility];  Service: Cardiovascular;;  . PERIPHERAL VASCULAR CATHETERIZATION N/A 06/26/2015   Procedure: Endovascular Repair/Stent Graft;  Surgeon: [name], MD;  Location: [facility];  Service: Cardiovascular;  Laterality: N/A;  . SPINAL FUSION  2011    Social History Social History   Tobacco Use  . Smoking status: Former Smoker    Years: 40.00    Quit date: 07/13/1998    Years since quitting: 21.7  . Smokeless tobacco: Never Used  Substance Use Topics  . Alcohol use: Yes    Comment:  very rare  . Drug use: No    Family History Family History  Problem Relation Age of Onset  . Heart attack Father     No Known Allergies   REVIEW OF SYSTEMS (Negative unless checked)  Constitutional: [] Weight loss  [] Fever  [] Chills Cardiac: [] Chest pain   [] Chest pressure   [] Palpitations   [] Shortness of breath when laying flat   [] Shortness of breath with exertion. Vascular:  [x] Pain in legs with walking   [] Pain in legs at rest  [] History of DVT   [] Phlebitis   [] Swelling in legs   [] Varicose veins   [] Non-healing ulcers Pulmonary:    [] Uses home oxygen   [] Productive cough   [] Hemoptysis   [] Wheeze  [] COPD   [] Asthma Neurologic:  [] Dizziness   [] Seizures   [] History of stroke   [] History of TIA  [] Aphasia   [] Vissual changes   [] Weakness or numbness in arm   [] Weakness or numbness in leg Musculoskeletal:   [] Joint swelling   [] Joint pain   [] Low back pain Hematologic:  [] Easy bruising  [] Easy bleeding   [] Hypercoagulable state   [] Anemic Gastrointestinal:  [] Diarrhea   [] Vomiting  [] Gastroesophageal reflux/heartburn   [] Difficulty swallowing. Genitourinary:  [] Chronic kidney disease   [] Difficult urination  [] Frequent urination   [] Blood in urine Skin:  [] Rashes   [] Ulcers  Psychological:  [] History of anxiety   []  History of major depression.  Physical Examination  Vitals:   04/15/20 1000  BP: 138/77  Pulse: 83  Resp: 16  Weight: 184 lb 9.6 oz (83.7 kg)   Body mass index is 25.75 kg/m. Gen: WD/WN, NAD Head: [place]/AT, No temporalis wasting.  Ear/Nose/Throat: Hearing grossly intact, nares w/o erythema or drainage Eyes: PER, EOMI, sclera nonicteric.  Neck: Supple, no large masses.   Pulmonary:  Good air movement, no audible wheezing bilaterally, no use of accessory muscles.  Cardiac: RRR, no JVD Vascular:  Vessel Right Left  Radial Palpable Palpable  Carotid Palpable Palpable  Gastrointestinal: Non-distended. No guarding/no peritoneal signs.  Musculoskeletal: M/S 5/5 throughout.  No deformity or atrophy.  Neurologic: CN 2-12 intact. Symmetrical.  Speech is fluent. Motor exam as listed above. Psychiatric: Judgment intact, Mood & affect appropriate for pt's clinical situation. Dermatologic: No rashes or ulcers noted.  No changes consistent with cellulitis.   CBC Lab Results  Component Value Date   WBC 7.9 07/03/2015   HGB 10.6 (L) 07/03/2015   HCT 32.8 (L) 07/03/2015   MCV 88.1 07/03/2015   PLT 260 07/03/2015    BMET    Component Value Date/Time   NA 140 07/01/2015 0456   K 3.9 07/01/2015 0456   CL  110 07/01/2015 0456   CO2 26 07/01/2015 0456   GLUCOSE 106 (H) 07/01/2015 0456   BUN 17 07/01/2015 0456   CREATININE 0.87 07/01/2015 0456   CALCIUM 7.8 (L) 07/01/2015 0456   GFRNONAA >60 07/01/2015 0456   GFRAA >60 07/01/2015 0456   CrCl cannot be calculated (Patient's most recent lab result is older than the maximum 21 days allowed.).  COAG Lab Results  Component Value Date   INR 1.23 06/30/2015   INR 1.46 06/29/2015   INR 1.08 06/18/2015    Radiology No results found.   Assessment/Plan 1. Abdominal aortic aneurysm (AAA) without rupture (HCC) Recommend: Patient is status post successful endovascular repair of the AAA.   No further intervention is required at this time.   No endoleak is detected and the aneurysm sac is stable.  The patient will  continue antiplatelet therapy as prescribed as well as aggressive management of hyperlipidemia. Exercise is again strongly encouraged.   However, endografts require continued surveillance with ultrasound or CT scan. This is mandatory to detect any changes that allow repressurization of the aneurysm sac.  The patient is informed that this would be asymptomatic.  The patient is reminded that lifelong routine surveillance is a necessity with an endograft. Patient will continue to follow-up at 12 month intervals with ultrasound of the aorta. - VAS US AORTA/IVC/ILIACS; Future  2. PAD (peripheral artery disease) (HCC) Recommend:  The patient has evidence of atherosclerosis of the lower extremities with claudication.  The patient does not voice lifestyle limiting changes at this point in time.  Noninvasive studies do not suggest clinically significant change.  No invasive studies, angiography or surgery at this time The patient should continue walking and begin a more formal exercise program.  The patient should continue antiplatelet therapy and aggressive treatment of the lipid abnormalities  No changes in the patient's  medications at this time  The patient should continue wearing graduated compression socks 10-15 mmHg strength to control the mild edema.  - VAS [place] ABI WITH/WO TBI; Future  3. Bilateral carotid artery stenosis Recommend:  Given the patient's asymptomatic subcritical stenosis no further invasive testing or surgery at this time.  Duplex ultrasound shows <50% stenosis bilaterally.  Continue antiplatelet therapy as prescribed Continue management of CAD, HTN and Hyperlipidemia Healthy heart diet,  encouraged exercise at least 4 times per week Follow up in 24 months with duplex ultrasound and physical exam   4. Coronary artery disease of native artery of native heart with stable angina pectoris (HCC) Continue cardiac and antihypertensive medications as already ordered and reviewed, no changes at this time.  Continue statin as ordered and reviewed, no changes at this time  Nitrates PRN for chest pain     Hortencia Pilar, MD  04/15/2020 11:20 AM

## 2020-05-22 ENCOUNTER — Other Ambulatory Visit: Payer: Self-pay

## 2020-05-22 ENCOUNTER — Encounter: Payer: Self-pay | Admitting: Emergency Medicine

## 2020-05-22 ENCOUNTER — Emergency Department
Admission: EM | Admit: 2020-05-22 | Discharge: 2020-05-22 | Disposition: A | Discharge status: Home / Self Care | Payer: Medicare Other | Attending: Emergency Medicine | Admitting: Emergency Medicine

## 2020-05-22 DIAGNOSIS — I2510 Atherosclerotic heart disease of native coronary artery without angina pectoris: Secondary | ICD-10-CM | POA: Diagnosis not present

## 2020-05-22 DIAGNOSIS — I10 Essential (primary) hypertension: Secondary | ICD-10-CM | POA: Diagnosis not present

## 2020-05-22 DIAGNOSIS — Z20822 Contact with and (suspected) exposure to covid-19: Secondary | ICD-10-CM | POA: Insufficient documentation

## 2020-05-22 DIAGNOSIS — Z7901 Long term (current) use of anticoagulants: Secondary | ICD-10-CM | POA: Insufficient documentation

## 2020-05-22 DIAGNOSIS — Z79899 Other long term (current) drug therapy: Secondary | ICD-10-CM | POA: Insufficient documentation

## 2020-05-22 DIAGNOSIS — J449 Chronic obstructive pulmonary disease, unspecified: Secondary | ICD-10-CM | POA: Insufficient documentation

## 2020-05-22 DIAGNOSIS — Z87891 Personal history of nicotine dependence: Secondary | ICD-10-CM | POA: Insufficient documentation

## 2020-05-22 DIAGNOSIS — R55 Syncope and collapse: Secondary | ICD-10-CM | POA: Diagnosis present

## 2020-05-22 LAB — URINALYSIS, COMPLETE (UACMP) WITH MICROSCOPIC
Bacteria, UA: NONE SEEN
Bilirubin Urine: NEGATIVE
Glucose, UA: NEGATIVE mg/dL
Hgb urine dipstick: NEGATIVE
Ketones, ur: NEGATIVE mg/dL
Leukocytes,Ua: NEGATIVE
Nitrite: NEGATIVE
Protein, ur: NEGATIVE mg/dL
Specific Gravity, Urine: 1.008 (ref 1.005–1.030)
pH: 7 (ref 5.0–8.0)

## 2020-05-22 LAB — CBC
HCT: 50.5 % (ref 39.0–52.0)
Hemoglobin: 16.5 g/dL (ref 13.0–17.0)
MCH: 29 pg (ref 26.0–34.0)
MCHC: 32.7 g/dL (ref 30.0–36.0)
MCV: 88.9 fL (ref 80.0–100.0)
Platelets: 214 10*3/uL (ref 150–400)
RBC: 5.68 MIL/uL (ref 4.22–5.81)
RDW: 14.4 % (ref 11.5–15.5)
WBC: 8.7 10*3/uL (ref 4.0–10.5)
nRBC: 0 % (ref 0.0–0.2)

## 2020-05-22 LAB — RESPIRATORY PANEL BY RT PCR (FLU A&B, COVID)
Influenza A by PCR: NEGATIVE
Influenza B by PCR: NEGATIVE
SARS Coronavirus 2 by RT PCR: NEGATIVE

## 2020-05-22 LAB — BASIC METABOLIC PANEL
Anion gap: 10 (ref 5–15)
BUN: 17 mg/dL (ref 8–23)
CO2: 26 mmol/L (ref 22–32)
Calcium: 9.1 mg/dL (ref 8.9–10.3)
Chloride: 103 mmol/L (ref 98–111)
Creatinine, Ser: 0.98 mg/dL (ref 0.61–1.24)
GFR, Estimated: >60 mL/min (ref >60)
Glucose, Bld: 115 mg/dL — ABNORMAL HIGH (ref 70–99)
Potassium: 4.6 mmol/L (ref 3.5–5.1)
Sodium: 139 mmol/L (ref 135–145)

## 2020-05-22 LAB — TROPONIN I (HIGH SENSITIVITY): Troponin I (High Sensitivity): 7 ng/L (ref <18)

## 2020-05-22 LAB — CBG MONITORING, ED: Glucose-Capillary: 103 mg/dL — ABNORMAL HIGH (ref 70–99)

## 2020-05-22 MED ORDER — SODIUM CHLORIDE 0.9 % IV BOLUS
1000.0000 mL | Freq: Once | INTRAVENOUS | Status: AC
  Administered 2020-05-22: 1000 mL via INTRAVENOUS

## 2020-05-22 NOTE — ED Triage Notes (Addendum)
STates woke up this morning and went to the bathroom where patient states he had a syncopal event.  States has history of Afib and has noticed that HR has been going more labile for 'a little while', states has episodes where HR jumps up to the 140's.  Seen by Cardiology last week for same, and is currently wearing a holter monitor.  Arrives AAOx3.  MAE equally and strong.  NAD

## 2020-05-22 NOTE — Discharge Instructions (Addendum)
Please drink plenty of fluids and obtain plenty of rest.  Please follow-up with Dr. Nehemiah Massed tomorrow 05/23/2020.  Return to the emergency department for any further episodes of dizziness/lightheadedness, any chest pain, shortness of breath or any other symptom personally concerning to yourself.

## 2020-05-22 NOTE — ED Notes (Signed)
Sent rainbow to the lab. 

## 2020-05-22 NOTE — ED Notes (Signed)
Rainbow sent to lab

## 2020-05-22 NOTE — ED Provider Notes (Signed)
[facility] Emergency Department Provider Note  Time seen: 11:23 AM  I have reviewed the triage vital signs and the nursing notes.   HISTORY  Chief Complaint Loss of Consciousness   HPI Jeremy Higgins is a 85 y.o. male with a past medical history of COPD, hypertension, presents to the emergency department after a near syncopal episode.  According to the patient around 7 AM this morning he was going to the restroom when he began feeling lightheaded and dizzy.  Patient states he had a near syncopal episode where he slumped down to the floor but does not believe he lost complete consciousness.  Did not hit his head.  Currently patient states he feels well.  Patient states he has been having issues where his heart rate will go up and down he has atrial fibrillation at baseline on Eliquis.  Patient has been seeing Dr. Nehemiah Massed and recently had a Holter monitor placed which the patient is wearing currently.  Patient denies any chest pain or shortness of breath now or at any point.  Did state he was somewhat diaphoretic after the event but denies any nausea or vomiting.   Past Medical History:  Diagnosis Date  . Aneurysm ([place])   . Arthritis    right foot andankle   . Atrial fibrillation ([place]) 2013  . COPD (chronic obstructive pulmonary disease) ([place])   . Glaucoma   . Hypertension     Patient Active Problem List   Diagnosis Date Noted  . Coronary artery disease 04/15/2020  . PAD (peripheral artery disease) ([place]) 04/18/2019  . Atherosclerosis of native arteries of extremity with intermittent claudication ([place]) 05/11/2016  . Chronic venous insufficiency 05/11/2016  . Retroperitoneal bleed 06/29/2015  . AAA (abdominal aortic aneurysm) ([place]) 06/26/2015  . Aneurysm of abdominal vessel ([place]) 08/13/2014  . Bilateral carotid artery stenosis 08/13/2014  . Carotid stenosis 09/12/2013    Past Surgical History:  Procedure Laterality Date  . CATARACT EXTRACTION  2012  .  COLONOSCOPY  2010   ? [place] MD  . FOOT SURGERY Right 1950's  . LEG SURGERY Right   . PERIPHERAL VASCULAR CATHETERIZATION N/A 06/18/2015   Procedure: Abdominal Aortogram w/Lower Extremity;  Surgeon: [name], MD;  Location: [facility];  Service: Cardiovascular;  Laterality: N/A;  . PERIPHERAL VASCULAR CATHETERIZATION  06/18/2015   Procedure: Lower Extremity Intervention;  Surgeon: [name], MD;  Location: [facility];  Service: Cardiovascular;;  . PERIPHERAL VASCULAR CATHETERIZATION N/A 06/26/2015   Procedure: Endovascular Repair/Stent Graft;  Surgeon: [name], MD;  Location: [facility];  Service: Cardiovascular;  Laterality: N/A;  . SPINAL FUSION  2011    Prior to Admission medications   Medication Sig Start Date End Date Taking? Authorizing Provider  cetirizine (ZYRTEC) 10 MG tablet TAKE 1 TABLET(10 MG) BY MOUTH EVERY DAY 09/12/18   [provider]  diltiazem (CARTIA XT) 180 MG 24 hr capsule take 1 capsule by mouth once daily 08/14/16   [provider]  ELIQUIS 5 MG TABS tablet Take 5 mg by mouth 2 (two) times daily.  09/10/15   [provider]  folic acid (FOLVITE) 1 MG tablet Take 1 mg by mouth daily.    [provider]  latanoprost (XALATAN) 0.005 % ophthalmic solution Apply 1 drop to eye at bedtime.    [provider]  lisinopril (PRINIVIL,ZESTRIL) 10 MG tablet Take 5 mg by mouth daily. Reported on 06/26/2015    [provider]  lovastatin (MEVACOR) 40 MG tablet Take 40 mg by mouth at bedtime.    [provider]  metoprolol tartrate (LOPRESSOR) 25 MG tablet Take 25 mg by mouth as needed.     [provider]  mupirocin ointment (BACTROBAN) 2 % Apply topically. 03/26/18   [provider]  sildenafil (VIAGRA) 100 MG tablet Take by mouth.    [provider]  vitamin C (ASCORBIC ACID) 500 MG tablet Take 500 mg by mouth daily.    [provider]     No Known Allergies  Family History  Problem Relation Age of Onset  . Heart attack Father     Social History Social History   Tobacco Use  . Smoking status: Former Smoker    Years: 40.00    Quit date: 07/13/1998    Years since quitting: 21.8  . Smokeless tobacco: Never Used  Substance Use Topics  . Alcohol use: Yes    Comment: very rare  . Drug use: No    Review of Systems Constitutional: Negative for fever. Cardiovascular: Negative for chest pain. Respiratory: Negative for shortness of breath. Gastrointestinal: Negative for abdominal pain, vomiting and diarrhea. Genitourinary: Negative for urinary compaints Musculoskeletal: Negative for musculoskeletal complaints Neurological: Negative for headache All other ROS negative  ____________________________________________   PHYSICAL EXAM:  VITAL SIGNS: ED Triage Vitals  Enc Vitals Group     BP 05/22/20 0941 131/73     Pulse Rate 05/22/20 0941 (!) 113     Resp 05/22/20 0941 16     Temp 05/22/20 1038 97.9 F (36.6 C)     Temp Source 05/22/20 0941 Oral     SpO2 05/22/20 0941 97 %     Weight 05/22/20 0942 184 lb 8.4 oz (83.7 kg)     Height 05/22/20 0942 5\' 11"  (1.803 m)     Head Circumference --      Peak Flow --      Pain Score 05/22/20 0942 0     Pain Loc --      Pain Edu? --      Excl. in [place]? --     Constitutional: Alert and oriented. Well appearing and in no distress. Eyes: Normal exam ENT      Head: Normocephalic and atraumatic.      Mouth/Throat: Mucous membranes are moist. Cardiovascular: Normal rate, regular rhythm.  Respiratory: Normal respiratory effort without tachypnea nor retractions. Breath sounds are clear  Gastrointestinal: Soft and nontender. No distention.   Musculoskeletal: Nontender with normal range of motion in all extremities. No lower extremity tenderness or edema. Neurologic:  Normal speech and language. No gross focal neurologic deficits  Skin:  Skin is warm, dry and intact.   Psychiatric: Mood and affect are normal.   ____________________________________________    EKG  EKG viewed and interpreted by myself shows atrial fibrillation with rapid ventricular sponsor 117 bpm with a slightly widened QRS, normal axis, largely normal intervals with nonspecific ST changes.  ____________________________________________    INITIAL IMPRESSION / ASSESSMENT AND PLAN / ED COURSE  Pertinent labs & imaging results that were available during my care of the patient were reviewed by me and considered in my medical decision making (see chart for details).   Patient presents emergency department for near syncope.  Overall the patient appears well currently.  Denies any recent illnesses fever cough congestion nausea vomiting or diarrhea.  We will check labs, IV hydrate and continue to closely monitor.  Patient agreeable to plan of care.  Currently reassuring physical exam and vitals.  Patient's lab work is largely within normal limits.  Troponin is negative.  I spoke to Dr. Ubaldo Glassing of cardiology who states they will see the patient in the office tomorrow.  Patient is scheduled to turning his Holter monitor tomorrow as well.  Given his reassuring work-up in the emergency department and reassuring vitals and as the patient feels well we will discharge home with cardiology follow-up.  Jeremy Higgins was evaluated in Emergency Department on 05/22/2020 for the symptoms described in the history of present illness. He was evaluated in the context of the global COVID-19 pandemic, which necessitated consideration that the patient might be at risk for infection with the SARS-CoV-2 virus that causes COVID-19. Institutional protocols and algorithms that pertain to the evaluation of patients at risk for COVID-19 are in a state of rapid change based on information released by regulatory bodies including the CDC and federal and state organizations. These policies and algorithms were followed during the  patient's care in the ED.  ____________________________________________   FINAL CLINICAL IMPRESSION(S) / ED DIAGNOSES  Near syncope   Harvest Dark, MD 05/22/20 1342

## 2020-06-19 ENCOUNTER — Other Ambulatory Visit: Payer: Self-pay

## 2020-06-19 ENCOUNTER — Ambulatory Visit (INDEPENDENT_AMBULATORY_CARE_PROVIDER_SITE_OTHER): Payer: Medicare Other | Admitting: Dermatology

## 2020-06-19 DIAGNOSIS — Z1283 Encounter for screening for malignant neoplasm of skin: Secondary | ICD-10-CM | POA: Diagnosis not present

## 2020-06-19 DIAGNOSIS — L570 Actinic keratosis: Secondary | ICD-10-CM | POA: Diagnosis not present

## 2020-06-19 DIAGNOSIS — L821 Other seborrheic keratosis: Secondary | ICD-10-CM

## 2020-06-19 DIAGNOSIS — L820 Inflamed seborrheic keratosis: Secondary | ICD-10-CM | POA: Diagnosis not present

## 2020-06-19 DIAGNOSIS — I6523 Occlusion and stenosis of bilateral carotid arteries: Secondary | ICD-10-CM

## 2020-06-19 DIAGNOSIS — L720 Epidermal cyst: Secondary | ICD-10-CM | POA: Diagnosis not present

## 2020-06-19 DIAGNOSIS — D692 Other nonthrombocytopenic purpura: Secondary | ICD-10-CM

## 2020-06-19 DIAGNOSIS — L578 Other skin changes due to chronic exposure to nonionizing radiation: Secondary | ICD-10-CM

## 2020-06-19 DIAGNOSIS — D229 Melanocytic nevi, unspecified: Secondary | ICD-10-CM

## 2020-06-19 DIAGNOSIS — D485 Neoplasm of uncertain behavior of skin: Secondary | ICD-10-CM | POA: Diagnosis not present

## 2020-06-19 NOTE — Patient Instructions (Signed)
Cryotherapy Aftercare  . Wash gently with soap and water everyday.   Marland Kitchen Apply Vaseline and Band-Aid daily until healed.  Wound Care Instructions  1. Cleanse wound gently with soap and water once a day then pat dry with clean gauze. Apply a thing coat of Petrolatum (petroleum jelly, "Vaseline") over the wound (unless you have an allergy to this). We recommend that you use a new, sterile tube of Vaseline. Do not pick or remove scabs. Do not remove the yellow or white "healing tissue" from the base of the wound.  2. Cover the wound with fresh, clean, nonstick gauze and secure with paper tape. You may use Band-Aids in place of gauze and tape if the would is small enough, but would recommend trimming much of the tape off as there is often too much. Sometimes Band-Aids can irritate the skin.  3. You should call the office for your biopsy report after 1 week if you have not already been contacted.  4. If you experience any problems, such as abnormal amounts of bleeding, swelling, significant bruising, significant pain, or evidence of infection, please call the office immediately.

## 2020-06-19 NOTE — Progress Notes (Signed)
New Patient Visit  Subjective  Jeremy Higgins is a 85 y.o. male who presents for the following: Spots to check (face, ears. Presents for years. No changes. They are irritated at times.  He will pick the ear growth off, but it grows back) and UBSE.   The following portions of the chart were reviewed this encounter and updated as appropriate:      Review of Systems:  No other skin or systemic complaints except as noted in HPI or Assessment and Plan.  Objective  Well appearing patient in no apparent distress; mood and affect are within normal limits.  All skin waist up examined.  Objective  L mid cheek x 2, R ear helix x 1, L ear antihelix x 2, L wrist x 1 (6): Erythematous keratotic or waxy stuck-on papule or plaque.   Objective  L lower lip vermilion edge x 1, nose x 2, forehead x 1, L temple x 1 (5): Erythematous thin papules/macules with gritty scale.   Objective  L paraspinal upper back: 1.1cm pink scaly patch      Objective  Left Posterior Shoulder: 1.0cm firm yellow SQ nodule   Assessment & Plan   Skin cancer screening performed today.  Actinic Damage - chronic, secondary to cumulative UV radiation exposure/sun exposure over time - diffuse scaly erythematous macules with underlying dyspigmentation - Recommend daily broad spectrum sunscreen SPF 30+ to sun-exposed areas, reapply every 2 hours as needed.  - Call for new or changing lesions.  Inflamed seborrheic keratosis (6) L mid cheek x 2, R ear helix x 1, L ear antihelix x 2, L wrist x 1  Destruction of lesion - L mid cheek x 2, R ear helix x 1, L ear antihelix x 2, L wrist x 1  Destruction method: cryotherapy   Informed consent: discussed and consent obtained   Lesion destroyed using liquid nitrogen: Yes   Region frozen until ice ball extended beyond lesion: Yes   Outcome: patient tolerated procedure well with no complications   Post-procedure details: wound care instructions given    AK (actinic  keratosis) (5) L lower lip vermilion edge x 1, nose x 2, forehead x 1, L temple x 1  Destruction of lesion - L lower lip vermilion edge x 1, nose x 2, forehead x 1, L temple x 1  Destruction method: cryotherapy   Informed consent: discussed and consent obtained   Lesion destroyed using liquid nitrogen: Yes   Region frozen until ice ball extended beyond lesion: Yes   Outcome: patient tolerated procedure well with no complications   Post-procedure details: wound care instructions given    Neoplasm of uncertain behavior of skin L paraspinal upper back  Skin / nail biopsy Type of biopsy: tangential   Informed consent: discussed and consent obtained   Patient was prepped and draped in usual sterile fashion: Area prepped with alcohol. Anesthesia: the lesion was anesthetized in a standard fashion   Anesthetic:  1% lidocaine w/ epinephrine 1-100,000 buffered w/ 8.4% NaHCO3 Instrument used: flexible razor blade   Hemostasis achieved with: pressure, aluminum chloride and electrodesiccation   Outcome: patient tolerated procedure well   Post-procedure details: wound care instructions given   Post-procedure details comment:  Ointment and small bandage applied  Specimen 1 - Surgical pathology Differential Diagnosis: ISK r/o BCC Check Margins: No 1.1cm pink scaly patch  Epidermal inclusion cyst Left Posterior Shoulder  Vs Lipoma  Benign-appearing. Exam most consistent with an epidermal inclusion cyst. Discussed that a cyst  is a benign growth that can grow over time and sometimes get irritated or inflamed. Recommend observation if it is not bothersome. Discussed option of surgical excision to remove it if it is growing, symptomatic, or other changes noted. Please call for new or changing lesions so they can be evaluated.    Seborrheic Keratoses - Stuck-on, waxy, tan-brown papules and plaques, including left ear antihelix, left mid cheek - Discussed benign etiology and prognosis. -  Observe - Call for any changes  Melanocytic Nevi - Tan-brown and/or pink-flesh-colored symmetric macules and papules - Benign appearing on exam today - Observation - Call clinic for new or changing moles - Recommend daily use of broad spectrum spf 30+ sunscreen to sun-exposed areas.    Purpura - Chronic; persistent and recurrent.  Treatable, but not curable. - Violaceous macules and patches - Benign - Related to age, sun damage and/or use of blood thinners - Observe - Can use OTC arnica containing moisturizer such as Dermend Bruise Formula if desired - Call for worsening or other concerns    Return in about 3 months (around 09/17/2020) for AKs, ISKs.   IJamesetta Orleans, CMA, am acting as scribe for Brendolyn Patty, MD .  Documentation: I have reviewed the above documentation for accuracy and completeness, and I agree with the above.  Brendolyn Patty MD

## 2020-06-24 ENCOUNTER — Telehealth: Payer: Self-pay

## 2020-06-24 NOTE — Addendum Note (Signed)
Addended by: Brendolyn Patty on: 06/24/2020 05:28 PM   Modules accepted: Level of Service

## 2020-06-24 NOTE — Telephone Encounter (Signed)
-----   Message from Brendolyn Patty, MD sent at 06/24/2020  9:07 AM EST ----- Skin , left paraspinal upper back LICHENOID KERATOSIS  Benign inflamed keratosis

## 2020-06-24 NOTE — Telephone Encounter (Signed)
Advised patient's wife biopsy was benign.

## 2020-09-24 ENCOUNTER — Ambulatory Visit (INDEPENDENT_AMBULATORY_CARE_PROVIDER_SITE_OTHER): Payer: Medicare Other | Admitting: Dermatology

## 2020-09-24 ENCOUNTER — Other Ambulatory Visit: Payer: Self-pay

## 2020-09-24 DIAGNOSIS — L719 Rosacea, unspecified: Secondary | ICD-10-CM

## 2020-09-24 DIAGNOSIS — L578 Other skin changes due to chronic exposure to nonionizing radiation: Secondary | ICD-10-CM

## 2020-09-24 DIAGNOSIS — R238 Other skin changes: Secondary | ICD-10-CM | POA: Diagnosis not present

## 2020-09-24 DIAGNOSIS — L570 Actinic keratosis: Secondary | ICD-10-CM | POA: Diagnosis not present

## 2020-09-24 DIAGNOSIS — L820 Inflamed seborrheic keratosis: Secondary | ICD-10-CM

## 2020-09-24 MED ORDER — METRONIDAZOLE 0.75 % EX GEL: CUTANEOUS | 3 refills | Status: DC

## 2020-09-24 NOTE — Patient Instructions (Addendum)
Cryotherapy Aftercare  . Wash gently with soap and water everyday.   Marland Kitchen Apply Vaseline and Band-Aid daily until healed.   Rosacea  What is rosacea? Rosacea (say: ro-zay-sha) is a common skin disease that usually begins as a trend of flushing or blushing easily.  As rosacea progresses, a persistent redness in the center of the face will develop and may gradually spread beyond the nose and cheeks to the forehead and chin.  In some cases, the ears, chest, and back could be affected.  Rosacea may appear as tiny blood vessels or small red bumps that occur in crops.  Frequently they can contain pus, and are called "pustules".  If the bumps do not contain pus, they are referred to as "papules".  Rarely, in prolonged, untreated cases of rosacea, the oil glands of the nose and cheeks may become permanently enlarged.  This is called rhinophyma, and is seen more frequently in men.  Signs and Risks In its beginning stages, rosacea tends to come and go, which makes it difficult to recognize.  It can start as intermittent flushing of the face.  Eventually, blood vessels may become permanently visible.  Pustules and papules can appear, but can be mistaken for adult acne.  People of all races, ages, genders and ethnic groups are at risk of developing rosacea.  However, it is more common in women (especially around menopause) and adults with fair skin between the ages of 67 and 43.  Treatment Dermatologists typically recommend a combination of treatments to effectively manage rosacea.  Treatment can improve symptoms and may stop the progression of the rosacea.  Treatment may involve both topical and oral medications.  The tetracycline antibiotics are often used for their anti-inflammatory effect; however, because of the possibility of developing antibiotic resistance, they should not be used long term at full dose.  For dilated blood vessels the options include electrodessication (uses electric current through a small  needle), laser treatment, and cosmetics to hide the redness.   With all forms of treatment, improvement is a slow process, and patients may not see any results for the first 3-4 weeks.  It is very important to avoid the sun and other triggers.  Patients must wear sunscreen daily.  Skin Care Instructions: 1. Cleanse the skin with a mild soap such as CeraVe cleanser, Cetaphil cleanser, or Dove soap once or twice daily as needed. 2. Moisturize with Eucerin Redness Relief Daily Perfecting Lotion (has a subtle green tint), CeraVe Moisturizing Cream, or Oil of Olay Daily Moisturizer with sunscreen every morning and/or night as recommended. 3. Makeup should be "non-comedogenic" (won't clog pores) and be labeled "for sensitive skin". Good choices for cosmetics are: Neutrogena, Almay, and Physician's Formula.  Any product with a green tint tends to offset a red complexion. 4. If your eyes are dry and irritated, use artificial tears 2-3 times per day and cleanse the eyelids daily with baby shampoo.  Have your eyes examined at least every 2 years.  Be sure to tell your eye doctor that you have rosacea. 5. Alcoholic beverages tend to cause flushing of the skin, and may make rosacea worse. 6. Always wear sunscreen, protect your skin from extreme hot and cold temperatures, and avoid spicy foods, hot drinks, and mechanical irritation such as rubbing, scrubbing, or massaging the face.  Avoid harsh skin cleansers, cleansing masks, astringents, and exfoliation. If a particular product burns or makes your face feel tight, then it is likely to flare your rosacea. 7. If you are  having difficulty finding a sunscreen that you can tolerate, you may try switching to a chemical-free sunscreen.  These are ones whose active ingredient is zinc oxide or titanium dioxide only.  They should also be fragrance free, non-comedogenic, and labeled for sensitive skin. 8. Rosacea triggers may vary from person to person.  There are a variety of  foods that have been reported to trigger rosacea.  Some patients find that keeping a diary of what they were doing when they flared helps them avoid triggers.

## 2020-09-24 NOTE — Progress Notes (Signed)
Follow-Up Visit   Subjective  Jeremy Higgins is a 86 y.o. male who presents for the following: Follow-up (Patient here for 3 month follow-up AKs and ISKs. He has a spot behind his right ear and on his right shoulder to be checked today. ). Which get irritated.  He uses a cream for rosacea.  The following portions of the chart were reviewed this encounter and updated as appropriate:       Review of Systems:  No other skin or systemic complaints except as noted in HPI or Assessment and Plan.  Objective  Well appearing patient in no apparent distress; mood and affect are within normal limits.  A focused examination was performed including face, shoulder. Relevant physical exam findings are noted in the Assessment and Plan.  Objective  nasal tip x 1, R temple x 2, R ear helix x 2, R cheek x 6, R forehead x 1, L ear helix x 1 (13): Erythematous thin papules/macules with gritty scale.   Objective  face: Mild erythema on nose and cheeks. Pink macule nasal tip, small pink papules forehead  Objective  R nasal ala: Soft violaceous papule- blanches  Objective  Right Postauricular Neck x 1, R post upper arm x 1 (2): Erythematous keratotic or waxy stuck-on papule    Assessment & Plan   Actinic Damage - chronic, secondary to cumulative UV radiation exposure/sun exposure over time - diffuse scaly erythematous macules with underlying dyspigmentation - Recommend daily broad spectrum sunscreen SPF 30+ to sun-exposed areas, reapply every 2 hours as needed.  - Recommend staying in the shade or wearing long sleeves, sun glasses (UVA+UVB protection) and wide brim hats (4-inch brim around the entire circumference of the hat). - Call for new or changing lesions.  AK (actinic keratosis) (13) nasal tip x 1, R temple x 2, R ear helix x 2, R cheek x 6, R forehead x 1, L ear helix x 1  (Vrs rosacea nasal tip) Recheck nasal tip on f/u. Consider bx if not improved  Destruction of lesion - nasal tip  x 1, R temple x 2, R ear helix x 2, R cheek x 6, R forehead x 1, L ear helix x 1  Destruction method: cryotherapy   Destruction method comment:  Electrodessication Informed consent: discussed and consent obtained   Lesion destroyed using liquid nitrogen: Yes   Region frozen until ice ball extended beyond lesion: Yes   Outcome: patient tolerated procedure well with no complications   Post-procedure details: wound care instructions given   Additional details:  Prior to procedure, discussed risks of blister formation, small wound, skin dyspigmentation, or rare scar following cryotherapy.  Rosacea face  Rosacea is a chronic progressive skin condition usually affecting the face of adults, causing redness and/or acne bumps. It is treatable but not curable. It sometimes affects the eyes (ocular rosacea) as well. It may respond to topical and/or systemic medication and can flare with stress, sun exposure, alcohol, exercise and some foods.  Daily application of broad spectrum spf 30+ sunscreen to face is recommended to reduce flares.  Switch from cream to Metronidazole gel apply to face qhs dsp 45g 3Rf.  metroNIDAZOLE (METROGEL) 0.75 % gel - face  Venous lake R nasal ala  Benign, observe.    Inflamed seborrheic keratosis (2) Right Postauricular Neck x 1, R post upper arm x 1  Destruction of lesion - Right Postauricular Neck x 1, R post upper arm x 1  Destruction method: cryotherapy  Informed consent: discussed and consent obtained   Lesion destroyed using liquid nitrogen: Yes   Region frozen until ice ball extended beyond lesion: Yes   Outcome: patient tolerated procedure well with no complications   Post-procedure details: wound care instructions given    Return in about 3 months (around 12/25/2020) for Rosacea, AKs/ISKs.   IJamesetta Orleans, CMA, am acting as scribe for Brendolyn Patty, MD .  Documentation: I have reviewed the above documentation for accuracy and completeness, and I  agree with the above.  Brendolyn Patty MD

## 2020-11-04 DIAGNOSIS — Z01818 Encounter for other preprocedural examination: Secondary | ICD-10-CM | POA: Insufficient documentation

## 2020-12-30 ENCOUNTER — Ambulatory Visit: Payer: Medicare Other | Admitting: Dermatology

## 2020-12-30 ENCOUNTER — Other Ambulatory Visit: Payer: Self-pay

## 2020-12-30 DIAGNOSIS — L820 Inflamed seborrheic keratosis: Secondary | ICD-10-CM

## 2020-12-30 DIAGNOSIS — L570 Actinic keratosis: Secondary | ICD-10-CM | POA: Diagnosis not present

## 2020-12-30 DIAGNOSIS — L578 Other skin changes due to chronic exposure to nonionizing radiation: Secondary | ICD-10-CM

## 2020-12-30 DIAGNOSIS — L821 Other seborrheic keratosis: Secondary | ICD-10-CM

## 2020-12-30 NOTE — Progress Notes (Signed)
Follow-Up Visit   Subjective  Jeremy Higgins is a 86 y.o. male who presents for the following: AK vs Rosacea recheck (Nasal tip, 25m f/u, LN2 and Metrogel ).   The following portions of the chart were reviewed this encounter and updated as appropriate:        Review of Systems:  No other skin or systemic complaints except as noted in HPI or Assessment and Plan.  Objective  Well appearing patient in no apparent distress; mood and affect are within normal limits.  A focused examination was performed including face. Relevant physical exam findings are noted in the Assessment and Plan.  L nasal dorsum x 1, L cheek x 2, L lower lip x 2, L ear mid helix x 1, R cheek x 1 (7) Pink scaly macules Nasal tip is clear  R zygoma x 1, Total = 1 Erythematous keratotic or waxy stuck-on papule   Assessment & Plan  AK (actinic keratosis) (7) L nasal dorsum x 1, L cheek x 2, L lower lip x 2, L ear mid helix x 1, R cheek x 1  Actinic keratoses are precancerous spots that appear secondary to cumulative UV radiation exposure/sun exposure over time. They are chronic with expected duration over 1 year. A portion of actinic keratoses will progress to squamous cell carcinoma of the skin. It is not possible to reliably predict which spots will progress to skin cancer and so treatment is recommended to prevent development of skin cancer.  Recommend daily broad spectrum sunscreen SPF 30+ to sun-exposed areas, reapply every 2 hours as needed.  Recommend staying in the shade or wearing long sleeves, sun glasses (UVA+UVB protection) and wide brim hats (4-inch brim around the entire circumference of the hat). Call for new or changing lesions.   Destruction of lesion - L nasal dorsum x 1, L cheek x 2, L lower lip x 2, L ear mid helix x 1, R cheek x 1  Destruction method: cryotherapy   Informed consent: discussed and consent obtained   Lesion destroyed using liquid nitrogen: Yes   Region frozen until ice ball  extended beyond lesion: Yes   Outcome: patient tolerated procedure well with no complications   Post-procedure details: wound care instructions given   Additional details:  Prior to procedure, discussed risks of blister formation, small wound, skin dyspigmentation, or rare scar following cryotherapy. Recommend Vaseline ointment to treated areas while healing.   Inflamed seborrheic keratosis R zygoma x 1, Total = 1  Recheck on f/u  Destruction of lesion - R zygoma x 1, Total = 1  Destruction method: cryotherapy   Informed consent: discussed and consent obtained   Lesion destroyed using liquid nitrogen: Yes   Region frozen until ice ball extended beyond lesion: Yes   Outcome: patient tolerated procedure well with no complications   Post-procedure details: wound care instructions given   Additional details:  Prior to procedure, discussed risks of blister formation, small wound, skin dyspigmentation, or rare scar following cryotherapy. Recommend Vaseline ointment to treated areas while healing.   Seborrheic Keratoses - Stuck-on, waxy, tan-brown papules and/or plaques  - Benign-appearing - Discussed benign etiology and prognosis. - Observe - Call for any changes  Actinic Damage - chronic, secondary to cumulative UV radiation exposure/sun exposure over time - diffuse scaly erythematous macules with underlying dyspigmentation - Recommend daily broad spectrum sunscreen SPF 30+ to sun-exposed areas, reapply every 2 hours as needed.  - Recommend staying in the shade or wearing long sleeves,  sun glasses (UVA+UVB protection) and wide brim hats (4-inch brim around the entire circumference of the hat). - Call for new or changing lesions.  Return in about 6 months (around 07/01/2021) for AK f/u, Recheck ISK.  I, Sonya Hupman, RMA, am acting as scribe for Brendolyn Patty, MD .  Documentation: I have reviewed the above documentation for accuracy and completeness, and I agree with the above.  Brendolyn Patty MD

## 2020-12-30 NOTE — Patient Instructions (Signed)

## 2021-04-13 NOTE — Progress Notes (Signed)
MRN : [id number]  Jeremy Higgins is a 86 y.o. ([birth year]) male who presents with chief complaint of check stent .  History of Present Illness:  The patient returns to the office for surveillance of an abdominal aortic aneurysm status post stent graft placement on 06/26/2015.    Patient denies abdominal pain or back pain, no other abdominal complaints. No groin related complaints. No symptoms consistent with distal embolization No changes in claudication distance.    There have been no interval changes in his overall healthcare since his last visit.    Patient denies amaurosis fugax or TIA symptoms. There is no history of claudication or rest pain symptoms of the lower extremities. The patient denies angina or shortness of breath.    Duplex US of the aorta and iliac arteries shows a 2.99 AAA sac with no endoleak,  (previous AAA sac measured 4.20 cm).   ABI Rt=0.81 and Lt=0.71 (previous ABI Rt=0.67 and Lt=0.71 )   Previous Carotid Duplex shows 4-09% RICA and <81% LICA; high grade external carotid  stenosis bilaterally.  No change compared to previous study dated December 31, 2016.  No outpatient medications have been marked as taking for the 04/14/21 encounter (Appointment) with Delana Meyer, Dolores Lory, MD.    Past Medical History:  Diagnosis Date   Aneurysm [facility])    Arthritis    right foot andankle    Atrial fibrillation ([place]) 2013   COPD (chronic obstructive pulmonary disease) ([place])    Glaucoma    Hypertension     Past Surgical History:  Procedure Laterality Date   CATARACT EXTRACTION  2012   COLONOSCOPY  2010   ? [place] MD   FOOT SURGERY Right 1950's   LEG SURGERY Right    PERIPHERAL VASCULAR CATHETERIZATION N/A 06/18/2015   Procedure: Abdominal Aortogram w/Lower Extremity;  Surgeon: [name], MD;  Location: [facility];  Service: Cardiovascular;  Laterality: N/A;   PERIPHERAL VASCULAR CATHETERIZATION  06/18/2015   Procedure: Lower Extremity Intervention;   Surgeon: [name], MD;  Location: [place];  Service: Cardiovascular;;   PERIPHERAL VASCULAR CATHETERIZATION N/A 06/26/2015   Procedure: Endovascular Repair/Stent Graft;  Surgeon: [name], MD;  Location: [facility] [facility];  Service: Cardiovascular;  Laterality: N/A;   SPINAL FUSION  2011    Social History Social History   Tobacco Use   Smoking status: Former    Years: 40.00    Types: Cigarettes    Quit date: 07/13/1998    Years since quitting: 22.7   Smokeless tobacco: Never  Substance Use Topics   Alcohol use: Yes    Comment: very rare   Drug use: No    Family History Family History  Problem Relation Age of Onset   Heart attack Father     No Known Allergies   REVIEW OF SYSTEMS (Negative unless checked)  Constitutional: [] Weight loss  [] Fever  [] Chills Cardiac: [] Chest pain   [] Chest pressure   [] Palpitations   [] Shortness of breath when laying flat   [] Shortness of breath with exertion. Vascular:  [] Pain in legs with walking   [] Pain in legs at rest  [] History of DVT   [] Phlebitis   [] Swelling in legs   [] Varicose veins   [] Non-healing ulcers Pulmonary:   [] Uses home oxygen   [] Productive cough   [] Hemoptysis   [] Wheeze  [] COPD   [] Asthma Neurologic:  [] Dizziness   [] Seizures   [] History of stroke   [] History of TIA  [] Aphasia   []   Vissual changes   [] Weakness or numbness in arm   [] Weakness or numbness in leg Musculoskeletal:   [] Joint swelling   [] Joint pain   [] Low back pain Hematologic:  [] Easy bruising  [] Easy bleeding   [] Hypercoagulable state   [] Anemic Gastrointestinal:  [] Diarrhea   [] Vomiting  [] Gastroesophageal reflux/heartburn   [] Difficulty swallowing. Genitourinary:  [] Chronic kidney disease   [] Difficult urination  [] Frequent urination   [] Blood in urine Skin:  [] Rashes   [] Ulcers  Psychological:  [] History of anxiety   []  History of major depression.  Physical Examination  There were no vitals filed for this visit. There  is no height or weight on file to calculate BMI. Gen: WD/WN, NAD Head: [place]/AT, No temporalis wasting.  Ear/Nose/Throat: Hearing grossly intact, nares w/o erythema or drainage Eyes: PER, EOMI, sclera nonicteric.  Neck: Supple, no masses.  No bruit or JVD.  Pulmonary:  Good air movement, no audible wheezing, no use of accessory muscles.  Cardiac: RRR, normal S1, S2, no Murmurs. Vascular:   Vessel Right Left  Radial Palpable Palpable  Carotid Palpable Palpable  Gastrointestinal: soft, non-distended. No guarding/no peritoneal signs.  Musculoskeletal: M/S 5/5 throughout.  No visible deformity.  Neurologic: CN 2-12 intact. Pain and light touch intact in extremities.  Symmetrical.  Speech is fluent. Motor exam as listed above. Psychiatric: Judgment intact, Mood & affect appropriate for pt's clinical situation. Dermatologic: No rashes or ulcers noted.  No changes consistent with cellulitis.   CBC Lab Results  Component Value Date   WBC 8.7 05/22/2020   HGB 16.5 05/22/2020   HCT 50.5 05/22/2020   MCV 88.9 05/22/2020   PLT 214 05/22/2020    BMET    Component Value Date/Time   NA 139 05/22/2020 0951   K 4.6 05/22/2020 0951   CL 103 05/22/2020 0951   CO2 26 05/22/2020 0951   GLUCOSE 115 (H) 05/22/2020 0951   BUN 17 05/22/2020 0951   CREATININE 0.98 05/22/2020 0951   CALCIUM 9.1 05/22/2020 0951   GFRNONAA >60 05/22/2020 0951   GFRAA >60 07/01/2015 0456   CrCl cannot be calculated (Patient's most recent lab result is older than the maximum 21 days allowed.).  COAG Lab Results  Component Value Date   INR 1.23 06/30/2015   INR 1.46 06/29/2015   INR 1.08 06/18/2015    Radiology No results found.   Assessment/Plan  1. Infrarenal abdominal aortic aneurysm (AAA) without rupture Recommend: Patient is status post successful endovascular repair of the AAA.    No further intervention is required at this time.   No endoleak is detected and the aneurysm sac is stable.   The  patient will continue antiplatelet therapy as prescribed as well as aggressive management of hyperlipidemia. Exercise is again strongly encouraged.    However, endografts require continued surveillance with ultrasound or CT scan. This is mandatory to detect any changes that allow repressurization of the aneurysm sac.  The patient is informed that this would be asymptomatic.   The patient is reminded that lifelong routine surveillance is a necessity with an endograft. Patient will continue to follow-up at 12 month intervals with ultrasound of the aorta. - VAS US AORTA/IVC/ILIACS; Future  2. 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  The patient does not voice lifestyle limiting changes at this point in time.   Noninvasive studies do not suggest clinically significant change.   No  invasive studies, angiography or surgery at this time The patient should continue walking and begin a more formal exercise program.  The patient should continue antiplatelet therapy and aggressive treatment of the lipid abnormalities   No changes in the patient's medications at this time   The patient should continue wearing graduated compression socks 10-15 mmHg strength to control the mild edema.  - VAS [place] ABI WITH/WO TBI; Future  3. Stenosis of carotid artery, unspecified laterality Recommend:   Given the patient's asymptomatic subcritical stenosis no further invasive testing or surgery at this time.   Duplex ultrasound shows <50% stenosis bilaterally.   Continue antiplatelet therapy as prescribed Continue management of CAD, HTN and Hyperlipidemia Healthy heart diet,  encouraged exercise at least 4 times per week Follow up in 24 months with duplex ultrasound and physical exam   4. Chronic venous insufficiency No surgery or intervention at this point  in time.  I have reviewed my discussion with the patient regarding venous insufficiency and why it causes symptoms. I have discussed with the patient the chronic skin changes that accompany venous insufficiency and the long term sequela such as ulceration. Patient will contnue wearing graduated compression stockings on a daily basis, as this has provided excellent control of his edema. The patient will put the stockings on first thing in the morning and removing them in the evening. The patient is reminded not to sleep in the stockings.  In addition, behavioral modification including elevation during the day will be initiated. Exercise is strongly encouraged.  Given the patient's good control and lack of any problems regarding the venous insufficiency and lymphedema a lymph pump in not need at this time.     Hortencia Pilar, MD  04/13/2021 8:36 PM

## 2021-04-14 ENCOUNTER — Encounter (INDEPENDENT_AMBULATORY_CARE_PROVIDER_SITE_OTHER): Payer: Self-pay | Admitting: Vascular Surgery

## 2021-04-14 ENCOUNTER — Other Ambulatory Visit: Payer: Self-pay

## 2021-04-14 ENCOUNTER — Ambulatory Visit (INDEPENDENT_AMBULATORY_CARE_PROVIDER_SITE_OTHER): Payer: Medicare Other

## 2021-04-14 ENCOUNTER — Ambulatory Visit (INDEPENDENT_AMBULATORY_CARE_PROVIDER_SITE_OTHER): Payer: Medicare Other | Admitting: Vascular Surgery

## 2021-04-14 VITALS — BP 133/66 | HR 79 | Resp 16 | Wt 180.2 lb

## 2021-04-14 DIAGNOSIS — I7143 Infrarenal abdominal aortic aneurysm, without rupture: Secondary | ICD-10-CM | POA: Diagnosis not present

## 2021-04-14 DIAGNOSIS — I714 Abdominal aortic aneurysm, without rupture, unspecified: Secondary | ICD-10-CM | POA: Diagnosis not present

## 2021-04-14 DIAGNOSIS — I70213 Atherosclerosis of native arteries of extremities with intermittent claudication, bilateral legs: Secondary | ICD-10-CM

## 2021-04-14 DIAGNOSIS — I872 Venous insufficiency (chronic) (peripheral): Secondary | ICD-10-CM | POA: Diagnosis not present

## 2021-04-14 DIAGNOSIS — I6529 Occlusion and stenosis of unspecified carotid artery: Secondary | ICD-10-CM | POA: Diagnosis not present

## 2021-04-14 DIAGNOSIS — I739 Peripheral vascular disease, unspecified: Secondary | ICD-10-CM | POA: Diagnosis not present

## 2021-07-22 ENCOUNTER — Other Ambulatory Visit: Payer: Self-pay

## 2021-07-22 ENCOUNTER — Encounter: Payer: Self-pay | Admitting: Dermatology

## 2021-07-22 ENCOUNTER — Ambulatory Visit: Payer: Medicare Other | Admitting: Dermatology

## 2021-07-22 DIAGNOSIS — L820 Inflamed seborrheic keratosis: Secondary | ICD-10-CM | POA: Diagnosis not present

## 2021-07-22 DIAGNOSIS — L578 Other skin changes due to chronic exposure to nonionizing radiation: Secondary | ICD-10-CM

## 2021-07-22 DIAGNOSIS — L570 Actinic keratosis: Secondary | ICD-10-CM | POA: Diagnosis not present

## 2021-07-22 DIAGNOSIS — L719 Rosacea, unspecified: Secondary | ICD-10-CM

## 2021-07-22 DIAGNOSIS — L821 Other seborrheic keratosis: Secondary | ICD-10-CM

## 2021-07-22 DIAGNOSIS — L814 Other melanin hyperpigmentation: Secondary | ICD-10-CM

## 2021-07-22 MED ORDER — METRONIDAZOLE 0.75 % EX GEL: CUTANEOUS | 5 refills | Status: DC

## 2021-07-22 NOTE — Progress Notes (Signed)
Follow-Up Visit   Subjective  Jeremy Higgins is a 87 y.o. male who presents for the following: Follow-up. Patient here for 6 month follow-up Aks. He has a spot on his left cheek to check- he thinks it was frozen in the past. Has spots in scalp that he picks at. Also recheck ISK treated last visit on the right zygoma. He uses metronidazole 0.75% gel as needed for rosacea.    The following portions of the chart were reviewed this encounter and updated as appropriate:       Review of Systems:  No other skin or systemic complaints except as noted in HPI or Assessment and Plan.  Objective  Well appearing patient in no apparent distress; mood and affect are within normal limits.  A focused examination was performed including face. Relevant physical exam findings are noted in the Assessment and Plan.  face Pink papules on the BL lower cheeks; erythema of the cheeks and nose.   Left Temple Pink scaly macule.   L medial cheek x 1, R vertex x 2 (3) Erythematous stuck-on, waxy papule  R zygoma is clear    Assessment & Plan  Actinic Damage - chronic, secondary to cumulative UV radiation exposure/sun exposure over time - diffuse scaly erythematous macules with underlying dyspigmentation - Recommend daily broad spectrum sunscreen SPF 30+ to sun-exposed areas, reapply every 2 hours as needed.  - Recommend staying in the shade or wearing long sleeves, sun glasses (UVA+UVB protection) and wide brim hats (4-inch brim around the entire circumference of the hat). - Call for new or changing lesions.  Rosacea face  Rosacea is a chronic progressive skin condition usually affecting the face of adults, causing redness and/or acne bumps. It is treatable but not curable. It sometimes affects the eyes (ocular rosacea) as well. It may respond to topical and/or systemic medication and can flare with stress, sun exposure, alcohol, exercise and some foods.  Daily application of broad spectrum spf 30+  sunscreen to face is recommended to reduce flares.  Increase metronidazole 0.75% gel to nightly to face dsp 45g 5Rf.   Related Medications metroNIDAZOLE (METROGEL) 0.75 % gel Apply to nose, forehead, cheeks every night for rosacea.  AK (actinic keratosis) Left Temple  Actinic keratoses are precancerous spots that appear secondary to cumulative UV radiation exposure/sun exposure over time. They are chronic with expected duration over 1 year. A portion of actinic keratoses will progress to squamous cell carcinoma of the skin. It is not possible to reliably predict which spots will progress to skin cancer and so treatment is recommended to prevent development of skin cancer.  Recommend daily broad spectrum sunscreen SPF 30+ to sun-exposed areas, reapply every 2 hours as needed.  Recommend staying in the shade or wearing long sleeves, sun glasses (UVA+UVB protection) and wide brim hats (4-inch brim around the entire circumference of the hat). Call for new or changing lesions.  Destruction of lesion - Left Temple  Destruction method: cryotherapy   Informed consent: discussed and consent obtained   Lesion destroyed using liquid nitrogen: Yes   Region frozen until ice ball extended beyond lesion: Yes   Outcome: patient tolerated procedure well with no complications   Post-procedure details: wound care instructions given   Additional details:  Prior to procedure, discussed risks of blister formation, small wound, skin dyspigmentation, or rare scar following cryotherapy. Recommend Vaseline ointment to treated areas while healing.   Inflamed seborrheic keratosis (3) L medial cheek x 1, R vertex x 2  Destruction of lesion - L medial cheek x 1, R vertex x 2  Destruction method: cryotherapy   Informed consent: discussed and consent obtained   Lesion destroyed using liquid nitrogen: Yes   Region frozen until ice ball extended beyond lesion: Yes   Outcome: patient tolerated procedure well with  no complications   Post-procedure details: wound care instructions given   Additional details:  Prior to procedure, discussed risks of blister formation, small wound, skin dyspigmentation, or rare scar following cryotherapy. Recommend Vaseline ointment to treated areas while healing.   Lentigines - Scattered tan macules - Due to sun exposure - Benign-appering, observe - Recommend daily broad spectrum sunscreen SPF 30+ to sun-exposed areas, reapply every 2 hours as needed. - Call for any changes  Seborrheic Keratoses - Stuck-on, waxy, tan-brown papules and/or plaques. Right zygoma clear.   - Benign-appearing - Discussed benign etiology and prognosis. - Observe - Call for any changes   Return in about 6 months (around 01/19/2022) for UBSE.  IJamesetta Orleans, CMA, am acting as scribe for Brendolyn Patty, MD .  Documentation: I have reviewed the above documentation for accuracy and completeness, and I agree with the above.  Brendolyn Patty MD

## 2021-07-22 NOTE — Patient Instructions (Addendum)
Cryotherapy Aftercare  Wash gently with soap and water everyday.   Apply Vaseline and Band-Aid daily until healed.   Metronidazole 0.75% gel Apply to face every night for rosacea.   Recommend daily broad spectrum sunscreen SPF 30+ to sun-exposed areas, reapply every 2 hours as needed. Call for new or changing lesions.  Staying in the shade or wearing long sleeves, sun glasses (UVA+UVB protection) and wide brim hats (4-inch brim around the entire circumference of the hat) are also recommended for sun protection.     If You Need Anything After Your Visit  If you have any questions or concerns for your doctor, please call our main line at [phone] and press option 4 to reach your doctor's medical assistant. If no one answers, please leave a voicemail as directed and we will return your call as soon as possible. Messages left after 4 pm will be answered the following business day.   You may also send [place] a message via [place]. We typically respond to MyChart messages within 1-2 business days.  For prescription refills, please ask your pharmacy to contact our office. Our fax number is [phone].  If you have an urgent issue when the clinic is closed that cannot wait until the next business day, you can page your doctor at the number below.    Please note that while we do our best to be available for urgent issues outside of office hours, we are not available 24/7.   If you have an urgent issue and are unable to reach [place], you may choose to seek medical care at your doctor's office, retail clinic, urgent care center, or emergency room.  If you have a medical emergency, please immediately call 911 or go to the emergency department.  Pager Numbers  - Dr. Nehemiah Massed: [phone]  - Dr. Laurence Ferrari: [phone]  - Dr. Nicole Kindred: [phone]  In the event of inclement weather, please call our main line at [phone] for an update on the status of any delays or closures.  Dermatology  Medication Tips: Please keep the boxes that topical medications come in in order to help keep track of the instructions about where and how to use these. Pharmacies typically print the medication instructions only on the boxes and not directly on the medication tubes.   If your medication is too expensive, please contact our office at [phone] option 4 or send [place] a message through [place].   We are unable to tell what your co-pay for medications will be in advance as this is different depending on your insurance coverage. However, we may be able to find a substitute medication at lower cost or fill out paperwork to get insurance to cover a needed medication.   If a prior authorization is required to get your medication covered by your insurance company, please allow [place] 1-2 business days to complete this process.  Drug prices often vary depending on where the prescription is filled and some pharmacies may offer cheaper prices.  The website www.goodrx.com contains coupons for medications through different pharmacies. The prices here do not account for what the cost may be with help from insurance (it may be cheaper with your insurance), but the website can give you the price if you did not use any insurance.  - You can print the associated coupon and take it with your prescription to the pharmacy.  - You may also stop by our office during regular business hours and pick up a GoodRx coupon card.  - If you  need your prescription sent electronically to a different pharmacy, notify our office through [facility] or by phone at [phone] option 4.     Si Usted Necesita Algo Despus de Su Visita  Tambin puede enviarnos un mensaje a travs de Pharmacist, community. Por lo general respondemos a los mensajes de MyChart en el transcurso de 1 a 2 das hbiles.  Para renovar recetas, por favor pida a su farmacia que se ponga en contacto con nuestra oficina. Harland Dingwall de fax es [place] [phone].  Si  tiene un asunto urgente cuando la clnica est cerrada y que no puede esperar hasta el siguiente da hbil, puede llamar/localizar a su doctor(a) al nmero que aparece a continuacin.   Por favor, tenga en cuenta que aunque hacemos todo lo posible para estar disponibles para asuntos urgentes fuera del horario de [place], no estamos disponibles las 24 horas del da, los 7 das de la [place].   Si tiene un problema urgente y no puede comunicarse con nosotros, puede optar por buscar atencin mdica  en el consultorio de su doctor(a), en una clnica privada, en un centro de atencin urgente o en una sala de emergencias.  Si tiene Engineering geologist, por favor llame inmediatamente al 911 o vaya a la sala de emergencias.  Nmeros de bper  - Dr. Nehemiah Massed: [phone]  - Dra. Moye: [phone]  - Dra. Nicole Kindred: [phone]  En caso de inclemencias del [place], por favor llame a Johnsie Kindred principal al [phone] para una actualizacin sobre el [place] de cualquier retraso o cierre.  Consejos para la medicacin en dermatologa: Por favor, guarde las cajas en las que vienen los medicamentos de uso tpico para ayudarle a seguir las instrucciones sobre dnde y cmo usarlos. Las farmacias generalmente imprimen las instrucciones del medicamento slo en las cajas y no directamente en los tubos del [place].   Si su medicamento es muy caro, por favor, pngase en contacto con Zigmund Daniel llamando al [phone] y presione la opcin 4 o envenos un mensaje a travs de Pharmacist, community.   No podemos decirle cul ser su copago por los medicamentos por adelantado ya que esto es diferente dependiendo de la cobertura de su seguro. Sin embargo, es posible que podamos encontrar un medicamento sustituto a Electrical engineer un formulario para que el seguro cubra el medicamento que se considera necesario.   Si se requiere una autorizacin previa para que su compaa de seguros [place] su medicamento, por favor  permtanos de 1 a 2 das hbiles para completar este proceso.  Los precios de los medicamentos varan con frecuencia dependiendo del Environmental consultant de dnde se surte la receta y alguna farmacias pueden ofrecer precios ms baratos.  El sitio web www.goodrx.com tiene cupones para medicamentos de Airline pilot. Los precios aqu no tienen en cuenta lo que podra costar con la ayuda del seguro (puede ser ms barato con su seguro), pero el sitio web puede darle el precio si no utiliz Research scientist (physical sciences).  - Puede imprimir el cupn correspondiente y llevarlo con su receta a la farmacia.  - Tambin puede pasar por nuestra oficina durante el horario de atencin regular y Charity fundraiser una tarjeta de cupones de GoodRx.  - Si necesita que su receta se enve electrnicamente a una farmacia diferente, informe a nuestra oficina a travs de MyChart de [place] o por telfono llamando al [phone] y presione la opcin 4.

## 2022-02-03 ENCOUNTER — Ambulatory Visit: Payer: Medicare Other | Admitting: Dermatology

## 2022-02-18 ENCOUNTER — Encounter: Payer: Self-pay | Admitting: Dermatology

## 2022-02-18 ENCOUNTER — Ambulatory Visit: Payer: Medicare Other | Admitting: Dermatology

## 2022-02-18 DIAGNOSIS — D229 Melanocytic nevi, unspecified: Secondary | ICD-10-CM

## 2022-02-18 DIAGNOSIS — L570 Actinic keratosis: Secondary | ICD-10-CM | POA: Diagnosis not present

## 2022-02-18 DIAGNOSIS — L719 Rosacea, unspecified: Secondary | ICD-10-CM

## 2022-02-18 DIAGNOSIS — L720 Epidermal cyst: Secondary | ICD-10-CM | POA: Diagnosis not present

## 2022-02-18 DIAGNOSIS — L814 Other melanin hyperpigmentation: Secondary | ICD-10-CM

## 2022-02-18 DIAGNOSIS — L820 Inflamed seborrheic keratosis: Secondary | ICD-10-CM | POA: Diagnosis not present

## 2022-02-18 DIAGNOSIS — L821 Other seborrheic keratosis: Secondary | ICD-10-CM

## 2022-02-18 DIAGNOSIS — Z1283 Encounter for screening for malignant neoplasm of skin: Secondary | ICD-10-CM

## 2022-02-18 DIAGNOSIS — D1800 Hemangioma unspecified site: Secondary | ICD-10-CM

## 2022-02-18 DIAGNOSIS — L578 Other skin changes due to chronic exposure to nonionizing radiation: Secondary | ICD-10-CM

## 2022-02-18 MED ORDER — METRONIDAZOLE 0.75 % EX GEL: CUTANEOUS | 5 refills | Status: AC

## 2022-02-18 NOTE — Patient Instructions (Signed)
Cryotherapy Aftercare  Wash gently with soap and water everyday.   Apply Vaseline daily until healed.    Melanoma ABCDEs  Melanoma is the most dangerous type of skin cancer, and is the leading cause of death from skin disease.  You are more likely to develop melanoma if you: Have light-colored skin, light-colored eyes, or red or blond hair Spend a lot of time in the sun Tan regularly, either outdoors or in a tanning bed Have had blistering sunburns, especially during childhood Have a close family member who has had a melanoma Have atypical moles or large birthmarks  Early detection of melanoma is key since treatment is typically straightforward and cure rates are extremely high if we catch it early.   The first sign of melanoma is often a change in a mole or a new dark spot.  The ABCDE system is a way of remembering the signs of melanoma.  A for asymmetry:  The two halves do not match. B for border:  The edges of the growth are irregular. C for color:  A mixture of colors are present instead of an even brown color. D for diameter:  Melanomas are usually (but not always) greater than 6mm - the size of a pencil eraser. E for evolution:  The spot keeps changing in size, shape, and color.  Please check your skin once per month between visits. You can use a small mirror in front and a large mirror behind you to keep an eye on the back side or your body.   If you see any new or changing lesions before your next follow-up, please call to schedule a visit.  Please continue daily skin protection including broad spectrum sunscreen SPF 30+ to sun-exposed areas, reapplying every 2 hours as needed when you're outdoors.   Staying in the shade or wearing long sleeves, sun glasses (UVA+UVB protection) and wide brim hats (4-inch brim around the entire circumference of the hat) are also recommended for sun protection.      Due to recent changes in healthcare laws, you may see results of your  pathology and/or laboratory studies on MyChart before the doctors have had a chance to review them. We understand that in some cases there may be results that are confusing or concerning to you. Please understand that not all results are received at the same time and often the doctors may need to interpret multiple results in order to provide you with the best plan of care or course of treatment. Therefore, we ask that you please give us 2 business days to thoroughly review all your results before contacting the office for clarification. Should we see a critical lab result, you will be contacted sooner.   If You Need Anything After Your Visit  If you have any questions or concerns for your doctor, please call our main line at [phone] and press option 4 to reach your doctor's medical assistant. If no one answers, please leave a voicemail as directed and we will return your call as soon as possible. Messages left after 4 pm will be answered the following business day.   You may also send us a message via MyChart. We typically respond to MyChart messages within 1-2 business days.  For prescription refills, please ask your pharmacy to contact our office. Our fax number is [phone].  If you have an urgent issue when the clinic is closed that cannot wait until the next business day, you can page your doctor at the number below.      below.    Please note that while we do our best to be available for urgent issues outside of office hours, we are not available 24/7.   If you have an urgent issue and are unable to reach us, you may choose to seek medical care at your doctor's office, retail clinic, urgent care center, or emergency room.  If you have a medical emergency, please immediately call 911 or go to the emergency department.  Pager Numbers  - Dr. Kowalski: [phone]  - Dr. Moye: [phone]  - Dr. Stewart: [phone]  In the event of inclement weather, please call our main line at  [phone] for an update on the status of any delays or closures.  Dermatology Medication Tips: Please keep the boxes that topical medications come in in order to help keep track of the instructions about where and how to use these. Pharmacies typically print the medication instructions only on the boxes and not directly on the medication tubes.   If your medication is too expensive, please contact our office at [phone] option 4 or send us a message through MyChart.   We are unable to tell what your co-pay for medications will be in advance as this is different depending on your insurance coverage. However, we may be able to find a substitute medication at lower cost or fill out paperwork to get insurance to cover a needed medication.   If a prior authorization is required to get your medication covered by your insurance company, please allow us 1-2 business days to complete this process.  Drug prices often vary depending on where the prescription is filled and some pharmacies may offer cheaper prices.  The website www.goodrx.com contains coupons for medications through different pharmacies. The prices here do not account for what the cost may be with help from insurance (it may be cheaper with your insurance), but the website can give you the price if you did not use any insurance.  - You can print the associated coupon and take it with your prescription to the pharmacy.  - You may also stop by our office during regular business hours and pick up a GoodRx coupon card.  - If you need your prescription sent electronically to a different pharmacy, notify our office through [place] MyChart or by phone at [phone] option 4.     Si Usted Necesita Algo Despus de Su Visita  Tambin puede enviarnos un mensaje a travs de MyChart. Por lo general respondemos a los mensajes de MyChart en el transcurso de 1 a 2 das hbiles.  Para renovar recetas, por favor pida a su farmacia que se  ponga en contacto con nuestra oficina. Nuestro nmero de fax es el [phone].  Si tiene un asunto urgente cuando la clnica est cerrada y que no puede esperar hasta el siguiente da hbil, puede llamar/localizar a su doctor(a) al nmero que aparece a continuacin.   Por favor, tenga en cuenta que aunque hacemos todo lo posible para estar disponibles para asuntos urgentes fuera del horario de oficina, no estamos disponibles las 24 horas del da, los 7 das de la semana.   Si tiene un problema urgente y no puede comunicarse con nosotros, puede optar por buscar atencin mdica  en el consultorio de su doctor(a), en una clnica privada, en un centro de atencin urgente o en una sala de emergencias.  Si tiene una emergencia mdica, por favor llame inmediatamente al 911 o vaya a la sala de emergencias.  Nmeros de bper  -   Dr. Kowalski: [phone]  - Dra. Moye: [phone]  - Dra. Stewart: [phone]  En caso de inclemencias del tiempo, por favor llame a nuestra lnea principal al [phone] para una actualizacin sobre el estado de cualquier retraso o cierre.  Consejos para la medicacin en dermatologa: Por favor, guarde las cajas en las que vienen los medicamentos de uso tpico para ayudarle a seguir las instrucciones sobre dnde y cmo usarlos. Las farmacias generalmente imprimen las instrucciones del medicamento slo en las cajas y no directamente en los tubos del medicamento.   Si su medicamento es muy caro, por favor, pngase en contacto con nuestra oficina llamando al [phone] y presione la opcin 4 o envenos un mensaje a travs de MyChart.   No podemos decirle cul ser su copago por los medicamentos por adelantado ya que esto es diferente dependiendo de la cobertura de su seguro. Sin embargo, es posible que podamos encontrar un medicamento sustituto a menor costo o llenar un formulario para que el seguro cubra el medicamento que se considera necesario.   Si se requiere  una autorizacin previa para que su compaa de seguros cubra su medicamento, por favor permtanos de 1 a 2 das hbiles para completar este proceso.  Los precios de los medicamentos varan con frecuencia dependiendo del lugar de dnde se surte la receta y alguna farmacias pueden ofrecer precios ms baratos.  El sitio web www.goodrx.com tiene cupones para medicamentos de diferentes farmacias. Los precios aqu no tienen en cuenta lo que podra costar con la ayuda del seguro (puede ser ms barato con su seguro), pero el sitio web puede darle el precio si no utiliz ningn seguro.  - Puede imprimir el cupn correspondiente y llevarlo con su receta a la farmacia.  - Tambin puede pasar por nuestra oficina durante el horario de atencin regular y recoger una tarjeta de cupones de GoodRx.  - Si necesita que su receta se enve electrnicamente a una farmacia diferente, informe a nuestra oficina a travs de MyChart de [place] o por telfono llamando al [phone] y presione la opcin 4.  

## 2022-02-18 NOTE — Progress Notes (Signed)
Follow-Up Visit   Subjective  Jeremy Higgins is a 87 y.o. male who presents for the following: Annual Exam (Here for skin cancer screening. Upper body. Hx of AK's. Lesion of concern behind right ear).  It is sore and he picks at it.  The patient presents for Upper Body Skin Exam (UBSE) for skin cancer screening and mole check.  The patient has spots, moles and lesions to be evaluated, some may be new or changing and the patient has concerns that these could be cancer.   The following portions of the chart were reviewed this encounter and updated as appropriate:      Review of Systems: No other skin or systemic complaints except as noted in HPI or Assessment and Plan.   Objective  Well appearing patient in no apparent distress; mood and affect are within normal limits.  All skin waist up examined.  Right Postauricular Area x1, left ear antihelix, recurrent, x1 Erythematous keratotic or waxy stuck-on papule  Left Lower Verm[place] Lip x1, right lateral cheek x2, right paranasal x1, left temple x1 (5) Erythematous thin papules/macules with gritty scale.   Left Shoulder - Posterior Subcutaneous nodule.   Left Lateral Canthus 1.3 x 1.5 tan patch     Right Malar Cheek Mild erythema, telangiectasias at  nose, mid face   Assessment & Plan   Lentigines - Scattered tan macules - Due to sun exposure - Benign-appearing, observe - Recommend daily broad spectrum sunscreen SPF 30+ to sun-exposed areas, reapply every 2 hours as needed. - Call for any changes  Seborrheic Keratoses. Face, back. - Stuck-on, waxy, tan-brown papules and/or plaques  - Benign-appearing - Discussed benign etiology and prognosis. - Observe - Call for any changes  Melanocytic Nevi - Tan-brown and/or pink-flesh-colored symmetric macules and papules - Benign appearing on exam today - Observation - Call clinic for new or changing moles - Recommend daily use of broad spectrum spf 30+ sunscreen to  sun-exposed areas.   Hemangiomas. Torso - Red papules - Discussed benign nature - Observe - Call for any changes  Actinic Damage - Chronic condition, secondary to cumulative UV/sun exposure - diffuse scaly erythematous macules with underlying dyspigmentation - Recommend daily broad spectrum sunscreen SPF 30+ to sun-exposed areas, reapply every 2 hours as needed.  - Staying in the shade or wearing long sleeves, sun glasses (UVA+UVB protection) and wide brim hats (4-inch brim around the entire circumference of the hat) are also recommended for sun protection.  - Call for new or changing lesions.  Skin cancer screening performed today.  Inflamed seborrheic keratosis Right Postauricular Area x1, left ear antihelix, recurrent, x1  Symptomatic, irritating, patient would like treated.  Destruction of lesion - Right Postauricular Area x1, left ear antihelix, recurrent, x1  Destruction method: cryotherapy   Informed consent: discussed and consent obtained   Lesion destroyed using liquid nitrogen: Yes   Region frozen until ice ball extended beyond lesion: Yes   Outcome: patient tolerated procedure well with no complications   Post-procedure details: wound care instructions given   Additional details:  Prior to procedure, discussed risks of blister formation, small wound, skin dyspigmentation, or rare scar following cryotherapy. Recommend Vaseline ointment to treated areas while healing.   AK (actinic keratosis) (5) Left Lower Vermilion Lip x1, right lateral cheek x2, right paranasal x1, left temple x1  Actinic keratoses are precancerous spots that appear secondary to cumulative UV radiation exposure/sun exposure over time. They are chronic with expected duration over 1 year. A portion  of actinic keratoses will progress to squamous cell carcinoma of the skin. It is not possible to reliably predict which spots will progress to skin cancer and so treatment is recommended to prevent development  of skin cancer.  Recommend daily broad spectrum sunscreen SPF 30+ to sun-exposed areas, reapply every 2 hours as needed.  Recommend staying in the shade or wearing long sleeves, sun glasses (UVA+UVB protection) and wide brim hats (4-inch brim around the entire circumference of the hat). Call for new or changing lesions.  Destruction of lesion - Left Lower [place] Lip x1, right lateral cheek x2, right paranasal x1, left temple x1  Destruction method: cryotherapy   Informed consent: discussed and consent obtained   Lesion destroyed using liquid nitrogen: Yes   Region frozen until ice ball extended beyond lesion: Yes   Outcome: patient tolerated procedure well with no complications   Post-procedure details: wound care instructions given   Additional details:  Prior to procedure, discussed risks of blister formation, small wound, skin dyspigmentation, or rare scar following cryotherapy. Recommend Vaseline ointment to treated areas while healing.   Epidermal inclusion cyst Left Shoulder - Posterior  Benign-appearing. Exam most consistent with an epidermal inclusion cyst. Discussed that a cyst is a benign growth that can grow over time and sometimes get irritated or inflamed. Recommend observation if it is not bothersome. Discussed option of surgical excision to remove it if it is growing, symptomatic, or other changes noted. Please call for new or changing lesions so they can be evaluated.    Lentigo Left Lateral Canthus  Vs SK  Benign-appearing.  Observation.  Call clinic for new or changing lesions.  Recommend daily use of broad spectrum spf 30+ sunscreen to sun-exposed areas.   Recheck on f/up  Rosacea Right Malar Cheek  Chronic condition with duration or expected duration over one year. Currently well-controlled.   Rosacea is a chronic progressive skin condition usually affecting the face of adults, causing redness and/or acne bumps. It is treatable but not curable. It  sometimes affects the eyes (ocular rosacea) as well. It may respond to topical and/or systemic medication and can flare with stress, sun exposure, alcohol, exercise and some foods.  Daily application of broad spectrum spf 30+ sunscreen to face is recommended to reduce flares.  Continue Metronidazole 0.75% gel twice daily as directed  Related Medications metroNIDAZOLE (METROGEL) 0.75 % gel Apply to nose, forehead, cheeks every night for rosacea.   Return in about 6 months (around 08/21/2022) for AK Follow Up, ISK Follow Up, Rosacea Follow Up.  I, Emelia Salisbury, CMA, am acting as scribe for Brendolyn Patty, MD.  Documentation: I have reviewed the above documentation for accuracy and completeness, and I agree with the above.  Brendolyn Patty MD

## 2022-04-13 ENCOUNTER — Ambulatory Visit (INDEPENDENT_AMBULATORY_CARE_PROVIDER_SITE_OTHER): Payer: Medicare Other | Admitting: Vascular Surgery

## 2022-04-13 ENCOUNTER — Encounter (INDEPENDENT_AMBULATORY_CARE_PROVIDER_SITE_OTHER): Payer: Medicare Other

## 2022-04-22 ENCOUNTER — Other Ambulatory Visit (INDEPENDENT_AMBULATORY_CARE_PROVIDER_SITE_OTHER): Payer: Self-pay | Admitting: Vascular Surgery

## 2022-04-22 DIAGNOSIS — I714 Abdominal aortic aneurysm, without rupture, unspecified: Secondary | ICD-10-CM

## 2022-04-22 DIAGNOSIS — I70213 Atherosclerosis of native arteries of extremities with intermittent claudication, bilateral legs: Secondary | ICD-10-CM

## 2022-04-23 ENCOUNTER — Encounter (INDEPENDENT_AMBULATORY_CARE_PROVIDER_SITE_OTHER): Payer: Self-pay | Admitting: Vascular Surgery

## 2022-04-23 ENCOUNTER — Ambulatory Visit (INDEPENDENT_AMBULATORY_CARE_PROVIDER_SITE_OTHER): Payer: Medicare Other

## 2022-04-23 ENCOUNTER — Ambulatory Visit (INDEPENDENT_AMBULATORY_CARE_PROVIDER_SITE_OTHER): Payer: Medicare Other | Admitting: Vascular Surgery

## 2022-04-23 VITALS — BP 156/71 | HR 84 | Resp 16 | Ht 71.0 in | Wt 177.0 lb

## 2022-04-23 DIAGNOSIS — Z9889 Other specified postprocedural states: Secondary | ICD-10-CM

## 2022-04-23 DIAGNOSIS — I714 Abdominal aortic aneurysm, without rupture, unspecified: Secondary | ICD-10-CM

## 2022-04-23 DIAGNOSIS — Z8679 Personal history of other diseases of the circulatory system: Secondary | ICD-10-CM

## 2022-04-23 DIAGNOSIS — I70213 Atherosclerosis of native arteries of extremities with intermittent claudication, bilateral legs: Secondary | ICD-10-CM

## 2022-04-23 DIAGNOSIS — I7143 Infrarenal abdominal aortic aneurysm, without rupture: Secondary | ICD-10-CM | POA: Diagnosis not present

## 2022-04-23 DIAGNOSIS — I6529 Occlusion and stenosis of unspecified carotid artery: Secondary | ICD-10-CM | POA: Diagnosis not present

## 2022-04-23 DIAGNOSIS — Z7901 Long term (current) use of anticoagulants: Secondary | ICD-10-CM | POA: Insufficient documentation

## 2022-04-23 DIAGNOSIS — I25118 Atherosclerotic heart disease of native coronary artery with other forms of angina pectoris: Secondary | ICD-10-CM

## 2022-04-25 ENCOUNTER — Encounter (INDEPENDENT_AMBULATORY_CARE_PROVIDER_SITE_OTHER): Payer: Self-pay | Admitting: Vascular Surgery

## 2022-04-25 NOTE — Progress Notes (Signed)
MRN : [id number]  Jeremy Higgins is a 87 y.o. (Nov 12, 1934) male who presents with chief complaint of check circulation.  History of Present Illness:  The patient returns to the office for surveillance of an abdominal aortic aneurysm status post stent graft placement on 06/26/2015.    Patient denies abdominal pain or back pain, no other abdominal complaints. No groin related complaints. No symptoms consistent with distal embolization No changes in claudication distance.    There have been no interval changes in his overall healthcare since his last visit.    Patient denies amaurosis fugax or TIA symptoms. There is no history of claudication or rest pain symptoms of the lower extremities. The patient denies angina or shortness of breath.    Duplex US of the aorta and iliac arteries shows a 3.65 AAA sac with no endoleak,  (previous AAA sac measured 4.20 cm).   ABI Rt=0.66 and Lt=0.60 (previous ABI Rt=0.81 and Lt=0.71)   Previous Carotid Duplex shows 7-02% RICA and <63% LICA; high grade external carotid  stenosis bilaterally.  No change compared to previous study dated December 31, 2016.   Current Meds  Medication Sig   bevacizumab (AVASTIN) 1.25 mg/0.1 mL SOLN 1.25 mg by Intravitreal route to Surgery. Both eyes   cetirizine (ZYRTEC) 10 MG tablet TAKE 1 TABLET(10 MG) BY MOUTH EVERY DAY   diltiazem (CARDIZEM CD) 180 MG 24 hr capsule take 1 capsule by mouth once daily   ELIQUIS 5 MG TABS tablet Take 5 mg by mouth 2 (two) times daily.    folic acid (FOLVITE) 1 MG tablet Take 1 mg by mouth daily.   latanoprost (XALATAN) 0.005 % ophthalmic solution Apply 1 drop to eye at bedtime.   lisinopril (PRINIVIL,ZESTRIL) 10 MG tablet Take 5 mg by mouth daily. Reported on 06/26/2015   lovastatin (MEVACOR) 40 MG tablet Take 40 mg by mouth at bedtime.   metoprolol tartrate (LOPRESSOR) 25 MG tablet Take 25 mg by mouth as needed.    metroNIDAZOLE (METROGEL) 0.75 % gel Apply to nose, forehead,  cheeks every night for rosacea.   mupirocin ointment (BACTROBAN) 2 % Apply topically.   sildenafil (VIAGRA) 100 MG tablet Take by mouth.   vitamin C (ASCORBIC ACID) 500 MG tablet Take 500 mg by mouth daily.    Past Medical History:  Diagnosis Date   Actinic keratosis    Aneurysm ([place])    Arthritis    right foot andankle    Atrial fibrillation ([place]) 07/14/2011   COPD (chronic obstructive pulmonary disease) (HCC)    Glaucoma    Hypertension     Past Surgical History:  Procedure Laterality Date   CATARACT EXTRACTION  2012   COLONOSCOPY  2010   ? [place] MD   FOOT SURGERY Right 1950's   LEG SURGERY Right    PERIPHERAL VASCULAR CATHETERIZATION N/A 06/18/2015   Procedure: Abdominal Aortogram w/Lower Extremity;  Surgeon: [name], MD;  Location: [facility];  Service: Cardiovascular;  Laterality: N/A;   PERIPHERAL VASCULAR CATHETERIZATION  06/18/2015   Procedure: Lower Extremity Intervention;  Surgeon: [name], MD;  Location: [facility];  Service: Cardiovascular;;   PERIPHERAL VASCULAR CATHETERIZATION N/A 06/26/2015   Procedure: Endovascular Repair/Stent Graft;  Surgeon: [name], MD;  Location: [facility];  Service: Cardiovascular;  Laterality: N/A;   SPINAL FUSION  2011    Social History Social History  Tobacco Use   Smoking status: Former    Years: 40.00    Types: Cigarettes    Quit date: 07/13/1998    Years since quitting: 23.8   Smokeless tobacco: Never  Substance Use Topics   Alcohol use: Yes    Comment: very rare   Drug use: No    Family History Family History  Problem Relation Age of Onset   Heart attack Father     No Known Allergies   REVIEW OF SYSTEMS (Negative unless checked)  Constitutional: '[]'$ Weight loss  '[]'$ Fever  '[]'$ Chills Cardiac: '[]'$ Chest pain   '[]'$ Chest pressure   '[]'$ Palpitations   '[]'$ Shortness of breath when laying flat   '[]'$ Shortness of breath with exertion. Vascular:  '[x]'$ Pain in legs with walking    '[]'$ Pain in legs at rest  '[]'$ History of DVT   '[]'$ Phlebitis   '[]'$ Swelling in legs   '[]'$ Varicose veins   '[]'$ Non-healing ulcers Pulmonary:   '[]'$ Uses home oxygen   '[]'$ Productive cough   '[]'$ Hemoptysis   '[]'$ Wheeze  '[]'$ COPD   '[]'$ Asthma Neurologic:  '[]'$ Dizziness   '[]'$ Seizures   '[]'$ History of stroke   '[]'$ History of TIA  '[]'$ Aphasia   '[]'$ Vissual changes   '[]'$ Weakness or numbness in arm   '[]'$ Weakness or numbness in leg Musculoskeletal:   '[]'$ Joint swelling   '[]'$ Joint pain   '[]'$ Low back pain Hematologic:  '[]'$ Easy bruising  '[]'$ Easy bleeding   '[]'$ Hypercoagulable state   '[]'$ Anemic Gastrointestinal:  '[]'$ Diarrhea   '[]'$ Vomiting  '[]'$ Gastroesophageal reflux/heartburn   '[]'$ Difficulty swallowing. Genitourinary:  '[]'$ Chronic kidney disease   '[]'$ Difficult urination  '[]'$ Frequent urination   '[]'$ Blood in urine Skin:  '[]'$ Rashes   '[]'$ Ulcers  Psychological:  '[]'$ History of anxiety   '[]'$  History of major depression.  Physical Examination  Vitals:   04/23/22 1100  BP: (!) 156/71  Pulse: 84  Resp: 16  Weight: 177 lb (80.3 kg)  Height: '5\' 11"'$  (1.803 m)   Body mass index is 24.69 kg/m. Gen: WD/WN, NAD Head: [place]/AT, No temporalis wasting.  Ear/Nose/Throat: Hearing grossly intact, nares w/o erythema or drainage Eyes: PER, EOMI, sclera nonicteric.  Neck: Supple, no masses.  No bruit or JVD.  Pulmonary:  Good air movement, no audible wheezing, no use of accessory muscles.  Cardiac: RRR, normal S1, S2, no Murmurs. Vascular:  mild trophic changes, no open wounds Vessel Right Left  Radial Palpable Palpable  PT Not Palpable Not Palpable  DP Not Palpable Not Palpable  Gastrointestinal: soft, non-distended. No guarding/no peritoneal signs.  Musculoskeletal: M/S 5/5 throughout.  No visible deformity.  Neurologic: CN 2-12 intact. Pain and light touch intact in extremities.  Symmetrical.  Speech is fluent. Motor exam as listed above. Psychiatric: Judgment intact, Mood & affect appropriate for pt's clinical situation. Dermatologic: No rashes or ulcers noted.  No changes  consistent with cellulitis.   CBC Lab Results  Component Value Date   WBC 8.7 05/22/2020   HGB 16.5 05/22/2020   HCT 50.5 05/22/2020   MCV 88.9 05/22/2020   PLT 214 05/22/2020    BMET    Component Value Date/Time   NA 139 05/22/2020 0951   K 4.6 05/22/2020 0951   CL 103 05/22/2020 0951   CO2 26 05/22/2020 0951   GLUCOSE 115 (H) 05/22/2020 0951   BUN 17 05/22/2020 0951   CREATININE 0.98 05/22/2020 0951   CALCIUM 9.1 05/22/2020 0951   GFRNONAA >60 05/22/2020 0951   GFRAA >60 07/01/2015 0456   CrCl cannot be calculated (Patient's most recent lab result is older than the maximum 21 days allowed.).  COAG Lab Results  Component Value Date   INR 1.23 06/30/2015   INR 1.46 06/29/2015   INR 1.08 06/18/2015    Radiology No results found.   Assessment/Plan 1. 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  The patient does not voice lifestyle limiting changes at this point in time.   Noninvasive studies do not suggest clinically significant change.   No invasive studies, angiography or surgery at this time The patient should continue walking and begin a more formal exercise program.  The patient should continue antiplatelet therapy and aggressive treatment of the lipid abnormalities   No changes in the patient's medications at this time   The patient should continue wearing graduated compression socks 10-15 mmHg strength to control the mild edema.  - VAS [place] ABI WITH/WO TBI; Future  2. Infrarenal abdominal aortic aneurysm (AAA) without rupture (HCC) Recommend: Patient is status post successful endovascular repair of the AAA.    No further intervention is required at this time.   No endoleak is detected and the aneurysm sac is stable.   The patient will continue antiplatelet therapy as prescribed as well as aggressive  management of hyperlipidemia. Exercise is again strongly encouraged.    However, endografts require continued surveillance with ultrasound or CT scan. This is mandatory to detect any changes that allow repressurization of the aneurysm sac.  The patient is informed that this would be asymptomatic.   The patient is reminded that lifelong routine surveillance is a necessity with an endograft. Patient will continue to follow-up at 12 month intervals with ultrasound of the aorta. - VAS US AORTA/IVC/ILIACS; Future  3. Stenosis of carotid artery, unspecified laterality Recommend:   Given the patient's asymptomatic subcritical stenosis no further invasive testing or surgery at this time.   Duplex ultrasound shows <50% stenosis bilaterally.   Continue antiplatelet therapy as prescribed Continue management of CAD, HTN and Hyperlipidemia Healthy heart diet,  encouraged exercise at least 4 times per week Follow up in 24 months with duplex ultrasound and physical exam   4. Coronary artery disease of native artery of native heart with stable angina pectoris (HCC) Continue cardiac and antihypertensive medications as already ordered and reviewed, no changes at this time.  Continue statin as ordered and reviewed, no changes at this time  Nitrates PRN for chest pain   5. Status post ablation of atrial fibrillation Continue antiarrhythmia medications as already ordered, these medications have been reviewed and there are no changes at this time.  Continue anticoagulation as ordered by Cardiology Service     Hortencia Pilar, MD  04/25/2022 1:14 PM

## 2022-05-11 ENCOUNTER — Encounter (INDEPENDENT_AMBULATORY_CARE_PROVIDER_SITE_OTHER): Payer: Self-pay

## 2022-08-24 ENCOUNTER — Encounter: Payer: Self-pay | Admitting: Dermatology

## 2022-08-24 ENCOUNTER — Ambulatory Visit: Payer: Medicare Other | Admitting: Dermatology

## 2022-08-24 VITALS — BP 130/64 | HR 67

## 2022-08-24 DIAGNOSIS — L719 Rosacea, unspecified: Secondary | ICD-10-CM | POA: Diagnosis not present

## 2022-08-24 DIAGNOSIS — L578 Other skin changes due to chronic exposure to nonionizing radiation: Secondary | ICD-10-CM | POA: Diagnosis not present

## 2022-08-24 DIAGNOSIS — L570 Actinic keratosis: Secondary | ICD-10-CM | POA: Diagnosis not present

## 2022-08-24 DIAGNOSIS — Z872 Personal history of diseases of the skin and subcutaneous tissue: Secondary | ICD-10-CM

## 2022-08-24 DIAGNOSIS — L821 Other seborrheic keratosis: Secondary | ICD-10-CM

## 2022-08-24 NOTE — Patient Instructions (Addendum)
Actinic keratoses are precancerous spots that appear secondary to cumulative UV radiation exposure/sun exposure over time. They are chronic with expected duration over 1 year. A portion of actinic keratoses will progress to squamous cell carcinoma of the skin. It is not possible to reliably predict which spots will progress to skin cancer and so treatment is recommended to prevent development of skin cancer.  Recommend daily broad spectrum sunscreen SPF 30+ to sun-exposed areas, reapply every 2 hours as needed.  Recommend staying in the shade or wearing long sleeves, sun glasses (UVA+UVB protection) and wide brim hats (4-inch brim around the entire circumference of the hat). Call for new or changing lesions.     Seborrheic Keratosis  What causes seborrheic keratoses? Seborrheic keratoses are harmless, common skin growths that first appear during adult life.  As time goes by, more growths appear.  Some people may develop a large number of them.  Seborrheic keratoses appear on both covered and uncovered body parts.  They are not caused by sunlight.  The tendency to develop seborrheic keratoses can be inherited.  They vary in color from skin-colored to gray, brown, or even black.  They can be either smooth or have a rough, warty surface.   Seborrheic keratoses are superficial and look as if they were stuck on the skin.  Under the microscope this type of keratosis looks like layers upon layers of skin.  That is why at times the top layer may seem to fall off, but the rest of the growth remains and re-grows.    Treatment Seborrheic keratoses do not need to be treated, but can easily be removed in the office.  Seborrheic keratoses often cause symptoms when they rub on clothing or jewelry.  Lesions can be in the way of shaving.  If they become inflamed, they can cause itching, soreness, or burning.  Removal of a seborrheic keratosis can be accomplished by freezing, burning, or surgery. If any spot bleeds,  scabs, or grows rapidly, please return to have it checked, as these can be an indication of a skin cancer.  Cryotherapy Aftercare  Wash gently with soap and water everyday.   Apply Vaseline and Band-Aid daily until healed.     Due to recent changes in healthcare laws, you may see results of your pathology and/or laboratory studies on MyChart before the doctors have had a chance to review them. We understand that in some cases there may be results that are confusing or concerning to you. Please understand that not all results are received at the same time and often the doctors may need to interpret multiple results in order to provide you with the best plan of care or course of treatment. Therefore, we ask that you please give [place] 2 business days to thoroughly review all your results before contacting the office for clarification. Should we see a critical lab result, you will be contacted sooner.   If You Need Anything After Your Visit  If you have any questions or concerns for your doctor, please call our main line at [phone] and press option 4 to reach your doctor's medical assistant. If no one answers, please leave a voicemail as directed and we will return your call as soon as possible. Messages left after 4 pm will be answered the following business day.   You may also send [place] a message via [place]. We typically respond to MyChart messages within 1-2 business days.  For prescription refills, please ask your pharmacy to contact our office.  Our fax number is [phone].  If you have an urgent issue when the clinic is closed that cannot wait until the next business day, you can page your doctor at the number below.    Please note that while we do our best to be available for urgent issues outside of office hours, we are not available 24/7.   If you have an urgent issue and are unable to reach [place], you may choose to seek medical care at your doctor's office, retail clinic, urgent care  center, or emergency room.  If you have a medical emergency, please immediately call 911 or go to the emergency department.  Pager Numbers  - Dr. Nehemiah Massed: [phone]  - Dr. Laurence Ferrari: [phone]  - Dr. Nicole Kindred: [phone]  In the event of inclement weather, please call our main line at [phone] for an update on the status of any delays or closures.  Dermatology Medication Tips: Please keep the boxes that topical medications come in in order to help keep track of the instructions about where and how to use these. Pharmacies typically print the medication instructions only on the boxes and not directly on the medication tubes.   If your medication is too expensive, please contact our office at [phone] option 4 or send [place] a message through [place].   We are unable to tell what your co-pay for medications will be in advance as this is different depending on your insurance coverage. However, we may be able to find a substitute medication at lower cost or fill out paperwork to get insurance to cover a needed medication.   If a prior authorization is required to get your medication covered by your insurance company, please allow [place] 1-2 business days to complete this process.  Drug prices often vary depending on where the prescription is filled and some pharmacies may offer cheaper prices.  The website www.goodrx.com contains coupons for medications through different pharmacies. The prices here do not account for what the cost may be with help from insurance (it may be cheaper with your insurance), but the website can give you the price if you did not use any insurance.  - You can print the associated coupon and take it with your prescription to the pharmacy.  - You may also stop by our office during regular business hours and pick up a GoodRx coupon card.  - If you need your prescription sent electronically to a different pharmacy, notify our office through [facility] or by  phone at [phone] option 4.     Si Usted Necesita Algo Despus de Su Visita  Tambin puede enviarnos un mensaje a travs de Pharmacist, community. Por lo general respondemos a los mensajes de MyChart en el transcurso de 1 a 2 das hbiles.  Para renovar recetas, por favor pida a su farmacia que se ponga en contacto con nuestra oficina. Harland Dingwall de fax es [place] [phone].  Si tiene un asunto urgente cuando la clnica est cerrada y que no puede esperar hasta el siguiente da hbil, puede llamar/localizar a su doctor(a) al nmero que aparece a continuacin.   Por favor, tenga en cuenta que aunque hacemos todo lo posible para estar disponibles para asuntos urgentes fuera del horario de [place], no estamos disponibles las 24 horas del da, los 7 das de la [place].   Si tiene un problema urgente y no puede comunicarse con nosotros, puede optar por buscar atencin mdica  en el consultorio de su doctor(a), en una clnica privada, en un  centro de atencin urgente o en una sala de emergencias.  Si tiene Engineering geologist, por favor llame inmediatamente al 911 o vaya a la sala de emergencias.  Nmeros de bper  - Dr. Nehemiah Massed: [phone]  - Dra. Moye: [phone]  - Dra. Nicole Kindred: [phone]  En caso de inclemencias del [place], por favor llame a Johnsie Kindred principal al [phone] para una actualizacin sobre el [place] de cualquier retraso o cierre.  Consejos para la medicacin en dermatologa: Por favor, guarde las cajas en las que vienen los medicamentos de uso tpico para ayudarle a seguir las instrucciones sobre dnde y cmo usarlos. Las farmacias generalmente imprimen las instrucciones del medicamento slo en las cajas y no directamente en los tubos del [place].   Si su medicamento es muy caro, por favor, pngase en contacto con Zigmund Daniel llamando al [phone] y presione la opcin 4 o envenos un mensaje a travs de Pharmacist, community.   No podemos decirle cul ser su copago  por los medicamentos por adelantado ya que esto es diferente dependiendo de la cobertura de su seguro. Sin embargo, es posible que podamos encontrar un medicamento sustituto a Electrical engineer un formulario para que el seguro cubra el medicamento que se considera necesario.   Si se requiere una autorizacin previa para que su compaa de seguros [place] su medicamento, por favor permtanos de 1 a 2 das hbiles para completar este proceso.  Los precios de los medicamentos varan con frecuencia dependiendo del Environmental consultant de dnde se surte la receta y alguna farmacias pueden ofrecer precios ms baratos.  El sitio web www.goodrx.com tiene cupones para medicamentos de Airline pilot. Los precios aqu no tienen en cuenta lo que podra costar con la ayuda del seguro (puede ser ms barato con su seguro), pero el sitio web puede darle el precio si no utiliz Research scientist (physical sciences).  - Puede imprimir el cupn correspondiente y llevarlo con su receta a la farmacia.  - Tambin puede pasar por nuestra oficina durante el horario de atencin regular y Charity fundraiser una tarjeta de cupones de GoodRx.  - Si necesita que su receta se enve electrnicamente a una farmacia diferente, informe a nuestra oficina a travs de MyChart de [place] o por telfono llamando al [phone] y presione la opcin 4.

## 2022-08-24 NOTE — Progress Notes (Signed)
Follow-Up Visit   Subjective  Jeremy Higgins is a 88 y.o. male who presents for the following: Actinic Keratosis (6 month hx of aks, noticed a spot below right eye he would like checked. ) and Rosacea (6 month. Hx of rosacea. Using metronidazole gel ).   The patient has spots, moles and lesions to be evaluated, some may be new or changing and the patient has concerns that these could be cancer.   The following portions of the chart were reviewed this encounter and updated as appropriate:      Review of Systems: No other skin or systemic complaints except as noted in HPI or Assessment and Plan.   Objective  Well appearing patient in no apparent distress; mood and affect are within normal limits.  A focused examination was performed including face b/l ears . Relevant physical exam findings are noted in the Assessment and Plan.  Head - Anterior (Face) Mild erythema and telangectasia   left central upper lip at vermillion x 1, left central lower lip at vermillion x 1 (2) Erythematous thin papules/macules with gritty scale.    Assessment & Plan  Rosacea Head - Anterior (Face)  Chronic condition with duration or expected duration over one year. Currently well-controlled.   Rosacea is a chronic progressive skin condition usually affecting the face of adults, causing redness and/or acne bumps. It is treatable but not curable. It sometimes affects the eyes (ocular rosacea) as well. It may respond to topical and/or systemic medication and can flare with stress, sun exposure, alcohol, exercise, topical steroids (including hydrocortisone/cortisone 10) and some foods.  Daily application of broad spectrum spf 30+ sunscreen to face is recommended to reduce flares.  Continue Metrogel 0.75% qd/bid  Related Medications metroNIDAZOLE (METROGEL) 0.75 % gel Apply to nose, forehead, cheeks every night for rosacea.  Actinic keratosis (2) left central upper lip at vermillion x 1, left central  lower lip at vermillion x 1  Actinic keratoses are precancerous spots that appear secondary to cumulative UV radiation exposure/sun exposure over time. They are chronic with expected duration over 1 year. A portion of actinic keratoses will progress to squamous cell carcinoma of the skin. It is not possible to reliably predict which spots will progress to skin cancer and so treatment is recommended to prevent development of skin cancer.  Recommend daily broad spectrum sunscreen SPF 30+ to sun-exposed areas, reapply every 2 hours as needed.  Recommend staying in the shade or wearing long sleeves, sun glasses (UVA+UVB protection) and wide brim hats (4-inch brim around the entire circumference of the hat). Call for new or changing lesions.  Destruction of lesion - left central upper lip at vermillion x 1, left central lower lip at vermillion x 1  Destruction method: cryotherapy   Informed consent: discussed and consent obtained   Lesion destroyed using liquid nitrogen: Yes   Region frozen until ice ball extended beyond lesion: Yes   Outcome: patient tolerated procedure well with no complications   Post-procedure details: wound care instructions given   Additional details:  Prior to procedure, discussed risks of blister formation, small wound, skin dyspigmentation, or rare scar following cryotherapy. Recommend Vaseline ointment to treated areas while healing.    Seborrheic Keratoses Left ear, left temple, left forehead, Right infraocular - Stuck-on, waxy, tan-brown papules and/or plaques  - Benign-appearing - Discussed benign etiology and prognosis. - Observe - Call for any changes  Telangiectasia Right nasal ala - Dilated blood vessel - Benign appearing on exam -  Call for changes   Actinic Damage - chronic, secondary to cumulative UV radiation exposure/sun exposure over time - diffuse scaly erythematous macules with underlying dyspigmentation - Recommend daily broad spectrum  sunscreen SPF 30+ to sun-exposed areas, reapply every 2 hours as needed.  - Recommend staying in the shade or wearing long sleeves, sun glasses (UVA+UVB protection) and wide brim hats (4-inch brim around the entire circumference of the hat). - Call for new or changing lesions.  History of PreCancerous Actinic Keratosis  Right paranasal , left temple , left antihelix , left lateral cheek - site(s) of PreCancerous Actinic Keratosis clear today. - these may recur and new lesions may form requiring treatment to prevent transformation into skin cancer - observe for new or changing spots and contact [place] for appointment if occur - photoprotection with sun protective clothing; sunglasses and broad spectrum sunscreen with SPF of at least 30 + and frequent self skin exams recommended - yearly exams by a dermatologist recommended for persons with history of PreCancerous Actinic Keratoses  Return in about 6 months (around 02/22/2023) for ubse hx of aks.  I, Ruthell Rummage, CMA, am acting as scribe for Brendolyn Patty, MD.  Documentation: I have reviewed the above documentation for accuracy and completeness, and I agree with the above.  Brendolyn Patty MD

## 2023-02-23 ENCOUNTER — Ambulatory Visit: Payer: Medicare Other | Admitting: Dermatology

## 2023-04-22 ENCOUNTER — Ambulatory Visit (INDEPENDENT_AMBULATORY_CARE_PROVIDER_SITE_OTHER): Payer: Medicare Other | Admitting: Vascular Surgery

## 2023-04-22 ENCOUNTER — Encounter (INDEPENDENT_AMBULATORY_CARE_PROVIDER_SITE_OTHER): Payer: Medicare Other

## 2023-04-22 ENCOUNTER — Other Ambulatory Visit (INDEPENDENT_AMBULATORY_CARE_PROVIDER_SITE_OTHER): Payer: Medicare Other

## 2023-05-17 ENCOUNTER — Other Ambulatory Visit (INDEPENDENT_AMBULATORY_CARE_PROVIDER_SITE_OTHER): Payer: Self-pay | Admitting: Vascular Surgery

## 2023-05-17 DIAGNOSIS — I7143 Infrarenal abdominal aortic aneurysm, without rupture: Secondary | ICD-10-CM

## 2023-05-17 DIAGNOSIS — I70213 Atherosclerosis of native arteries of extremities with intermittent claudication, bilateral legs: Secondary | ICD-10-CM

## 2023-05-18 NOTE — Progress Notes (Signed)
MRN : [id number]  Jeremy Higgins is a 88 y.o. (1935/04/14) male who presents with chief complaint of check circulation.  History of Present Illness:   The patient returns to the office for discussion of a type II endoleak in association with an abdominal aortic aneurysm status post stent graft placement on 06/26/2015.    Patient denies abdominal pain or back pain, no other abdominal complaints. No groin related complaints. No symptoms consistent with distal embolization No changes in claudication distance.    There have been no interval changes in his overall healthcare since his last visit.    Patient denies amaurosis fugax or TIA symptoms. There is no history of claudication or rest pain symptoms of the lower extremities. The patient denies angina or shortness of breath.    Duplex US of the aorta and iliac arteries shows a 3.66 AAA sac with no endoleak,  (previous AAA sac measured 3.60 cm).   ABI Rt=0.58 and Lt=0.65 (previous ABI Rt=0.66 and Lt=0.60)   Previous Carotid Duplex shows 1-39% RICA and <50% LICA; high grade external carotid  stenosis bilaterally.  No change compared to previous study dated December 31, 2016.  No outpatient medications have been marked as taking for the 05/20/23 encounter (Appointment) with Gilda Crease, Latina Craver, MD.    Past Medical History:  Diagnosis Date   Actinic keratosis    Aneurysm (HCC)    Arthritis    right foot andankle    Atrial fibrillation (HCC) 07/14/2011   COPD (chronic obstructive pulmonary disease) (HCC)    Glaucoma    Hypertension     Past Surgical History:  Procedure Laterality Date   CATARACT EXTRACTION  2012   COLONOSCOPY  2010   ? [facility] MD   FOOT SURGERY Right 1950's   LEG SURGERY Right    PERIPHERAL VASCULAR CATHETERIZATION N/A 06/18/2015   Procedure: Abdominal Aortogram w/Lower Extremity;  Surgeon: Renford Dills, MD;  Location: [facility] INVASIVE CV LAB;   Service: Cardiovascular;  Laterality: N/A;   PERIPHERAL VASCULAR CATHETERIZATION  06/18/2015   Procedure: Lower Extremity Intervention;  Surgeon: Renford Dills, MD;  Location: [facility] INVASIVE CV LAB;  Service: Cardiovascular;;   PERIPHERAL VASCULAR CATHETERIZATION N/A 06/26/2015   Procedure: Endovascular Repair/Stent Graft;  Surgeon: Renford Dills, MD;  Location: [facility] INVASIVE CV LAB;  Service: Cardiovascular;  Laterality: N/A;   SPINAL FUSION  2011    Social History Social History   Tobacco Use   Smoking status: Former    Current packs/day: 0.00    Types: Cigarettes    Start date: 07/13/1958    Quit date: 07/13/1998    Years since quitting: 24.8   Smokeless tobacco: Never  Substance Use Topics   Alcohol use: Yes    Comment: very rare   Drug use: No    Family History Family History  Problem Relation Age of Onset   Heart attack Father     No Known Allergies   REVIEW OF SYSTEMS (Negative unless checked)  Constitutional: [] Weight loss  [] Fever  [] Chills Cardiac: [] Chest pain   [] Chest pressure   [] Palpitations   [] Shortness  of breath when laying flat   [] Shortness of breath with exertion. Vascular:  [x] Pain in legs with walking   [] Pain in legs at rest  [] History of DVT   [] Phlebitis   [] Swelling in legs   [] Varicose veins   [] Non-healing ulcers Pulmonary:   [] Uses home oxygen   [] Productive cough   [] Hemoptysis   [] Wheeze  [] COPD   [] Asthma Neurologic:  [] Dizziness   [] Seizures   [] History of stroke   [] History of TIA  [] Aphasia   [] Vissual changes   [] Weakness or numbness in arm   [] Weakness or numbness in leg Musculoskeletal:   [] Joint swelling   [] Joint pain   [] Low back pain Hematologic:  [] Easy bruising  [] Easy bleeding   [] Hypercoagulable state   [] Anemic Gastrointestinal:  [] Diarrhea   [] Vomiting  [] Gastroesophageal reflux/heartburn   [] Difficulty swallowing. Genitourinary:  [] Chronic kidney disease   [] Difficult urination  [] Frequent urination   [] Blood in  urine Skin:  [] Rashes   [] Ulcers  Psychological:  [] History of anxiety   []  History of major depression.  Physical Examination  There were no vitals filed for this visit. There is no height or weight on file to calculate BMI. Gen: WD/WN, NAD Head: [place]/AT, No temporalis wasting.  Ear/Nose/Throat: Hearing grossly intact, nares w/o erythema or drainage Eyes: PER, EOMI, sclera nonicteric.  Neck: Supple, no masses.  No bruit or JVD.  Pulmonary:  Good air movement, no audible wheezing, no use of accessory muscles.  Cardiac: RRR, normal S1, S2, no Murmurs. Vascular:  mild trophic changes, no open wounds; no carotid bruits Vessel Right Left  Radial Palpable Palpable  PT Not Palpable Not Palpable  DP Not Palpable Not Palpable  Gastrointestinal: soft, non-distended. No guarding/no peritoneal signs.  Musculoskeletal: M/S 5/5 throughout.  No visible deformity.  Neurologic: CN 2-12 intact. Pain and light touch intact in extremities.  Symmetrical.  Speech is fluent. Motor exam as listed above. Psychiatric: Judgment intact, Mood & affect appropriate for pt's clinical situation. Dermatologic: No rashes or ulcers noted.  No changes consistent with cellulitis.   CBC Lab Results  Component Value Date   WBC 8.7 05/22/2020   HGB 16.5 05/22/2020   HCT 50.5 05/22/2020   MCV 88.9 05/22/2020   PLT 214 05/22/2020    BMET    Component Value Date/Time   NA 139 05/22/2020 0951   K 4.6 05/22/2020 0951   CL 103 05/22/2020 0951   CO2 26 05/22/2020 0951   GLUCOSE 115 (H) 05/22/2020 0951   BUN 17 05/22/2020 0951   CREATININE 0.98 05/22/2020 0951   CALCIUM 9.1 05/22/2020 0951   GFRNONAA >60 05/22/2020 0951   GFRAA >60 07/01/2015 0456   CrCl cannot be calculated (Patient's most recent lab result is older than the maximum 21 days allowed.).  COAG Lab Results  Component Value Date   INR 1.23 06/30/2015   INR 1.46 06/29/2015   INR 1.08 06/18/2015    Radiology No results  found.   Assessment/Plan 1. Infrarenal abdominal aortic aneurysm (AAA) without rupture (HCC) Recommend:  Patient is status post successful endovascular repair of the AAA.   No further intervention is required at this time.   No endoleak is detected and the aneurysm sac is stable.  The patient will continue antiplatelet therapy as prescribed as well as aggressive management of hyperlipidemia. Exercise is encouraged.   However, endografts require continued surveillance with ultrasound or CT scan. This is mandatory to detect any changes that allow repressurization of the aneurysm sac.  The  patient is informed that this would be asymptomatic.  The patient is reminded that lifelong routine surveillance is a necessity with an endograft. Patient will continue to follow-up at the specified interval with ultrasound of the aorta. - VAS US AORTA/IVC/ILIACS; Future  2. Stenosis of carotid artery, unspecified laterality Recommend:   Given the patient's asymptomatic subcritical stenosis no further invasive testing or surgery at this time.   Duplex ultrasound shows <50% stenosis bilaterally.   Continue antiplatelet therapy as prescribed Continue management of CAD, HTN and Hyperlipidemia Healthy heart diet,  encouraged exercise at least 4 times per week Follow up in 24 months with duplex ultrasound and physical exam   3. 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  The patient does not voice lifestyle limiting changes at this point in time.   Noninvasive studies do not suggest clinically significant change.   No invasive studies, angiography or surgery at this time The patient should continue walking and begin a more formal exercise program.  The patient should continue antiplatelet therapy and aggressive treatment of the lipid abnormalities    No changes in the patient's medications at this time   The patient should continue wearing graduated compression socks 10-15 mmHg strength to control the mild edema.   4. Coronary artery disease of native artery of native heart with stable angina pectoris (HCC) Continue cardiac and antihypertensive medications as already ordered and reviewed, no changes at this time.  Continue statin as ordered and reviewed, no changes at this time  Nitrates PRN for chest pain  5. Chronic venous insufficiency No surgery or intervention at this point in time.   The patient is CEAP [id number]   I have discussed with the patient venous insufficiency and why it  causes symptoms. I have discussed with the patient the chronic skin changes that accompany venous insufficiency and the long term sequela such as infection and ulceration.  Patient will begin wearing graduated compression stockings or compression wraps on a daily basis.  The patient will put the compression on first thing in the morning and removing them in the evening. The patient is instructed specifically not to sleep in the compression.    In addition, behavioral modification including several periods of elevation of the lower extremities during the day will be continued. I have demonstrated that proper elevation is a position with the ankles at heart level.  The patient is instructed to begin routine exercise, especially walking on a daily basis  The patient will be assessed for a Lymph Pump depending on the effectiveness of conservative therapy and the control of the associated lymphedema.    Levora Dredge, MD  05/18/2023 6:13 PM

## 2023-05-20 ENCOUNTER — Encounter (INDEPENDENT_AMBULATORY_CARE_PROVIDER_SITE_OTHER): Payer: Self-pay | Admitting: Vascular Surgery

## 2023-05-20 ENCOUNTER — Ambulatory Visit (INDEPENDENT_AMBULATORY_CARE_PROVIDER_SITE_OTHER): Payer: Medicare Other | Admitting: Vascular Surgery

## 2023-05-20 ENCOUNTER — Ambulatory Visit (INDEPENDENT_AMBULATORY_CARE_PROVIDER_SITE_OTHER): Payer: Medicare Other

## 2023-05-20 VITALS — BP 119/74 | HR 91 | Resp 18 | Ht 71.0 in | Wt 172.6 lb

## 2023-05-20 DIAGNOSIS — I70213 Atherosclerosis of native arteries of extremities with intermittent claudication, bilateral legs: Secondary | ICD-10-CM | POA: Diagnosis not present

## 2023-05-20 DIAGNOSIS — I7143 Infrarenal abdominal aortic aneurysm, without rupture: Secondary | ICD-10-CM

## 2023-05-20 DIAGNOSIS — I6529 Occlusion and stenosis of unspecified carotid artery: Secondary | ICD-10-CM

## 2023-05-20 DIAGNOSIS — I872 Venous insufficiency (chronic) (peripheral): Secondary | ICD-10-CM

## 2023-05-20 DIAGNOSIS — I25118 Atherosclerotic heart disease of native coronary artery with other forms of angina pectoris: Secondary | ICD-10-CM | POA: Diagnosis not present

## 2023-05-20 LAB — VAS US ABI WITH/WO TBI
Left ABI: 0.65
Right ABI: 0.58

## 2023-11-30 ENCOUNTER — Encounter (INDEPENDENT_AMBULATORY_CARE_PROVIDER_SITE_OTHER): Payer: Self-pay

## 2024-03-07 ENCOUNTER — Encounter: Payer: Self-pay | Admitting: Dermatology

## 2024-03-07 ENCOUNTER — Ambulatory Visit: Admitting: Dermatology

## 2024-03-07 DIAGNOSIS — I781 Nevus, non-neoplastic: Secondary | ICD-10-CM

## 2024-03-07 DIAGNOSIS — L821 Other seborrheic keratosis: Secondary | ICD-10-CM

## 2024-03-07 DIAGNOSIS — L719 Rosacea, unspecified: Secondary | ICD-10-CM

## 2024-03-07 DIAGNOSIS — L720 Epidermal cyst: Secondary | ICD-10-CM

## 2024-03-07 DIAGNOSIS — L578 Other skin changes due to chronic exposure to nonionizing radiation: Secondary | ICD-10-CM | POA: Diagnosis not present

## 2024-03-07 DIAGNOSIS — W908XXA Exposure to other nonionizing radiation, initial encounter: Secondary | ICD-10-CM | POA: Diagnosis not present

## 2024-03-07 DIAGNOSIS — L814 Other melanin hyperpigmentation: Secondary | ICD-10-CM

## 2024-03-07 DIAGNOSIS — Z1283 Encounter for screening for malignant neoplasm of skin: Secondary | ICD-10-CM

## 2024-03-07 DIAGNOSIS — Z872 Personal history of diseases of the skin and subcutaneous tissue: Secondary | ICD-10-CM

## 2024-03-07 DIAGNOSIS — D229 Melanocytic nevi, unspecified: Secondary | ICD-10-CM

## 2024-03-07 DIAGNOSIS — L729 Follicular cyst of the skin and subcutaneous tissue, unspecified: Secondary | ICD-10-CM

## 2024-03-07 DIAGNOSIS — D1801 Hemangioma of skin and subcutaneous tissue: Secondary | ICD-10-CM

## 2024-03-07 DIAGNOSIS — L820 Inflamed seborrheic keratosis: Secondary | ICD-10-CM

## 2024-03-07 NOTE — Patient Instructions (Addendum)
 Cryotherapy Aftercare  Wash gently with soap and water everyday.   Apply Vaseline and Band-Aid daily until healed.     Continue Metronidazole  0.75% gel to face at bedtime for rosacea.    Recommend daily broad spectrum sunscreen SPF 30+ to sun-exposed areas, reapply every 2 hours as needed. Call for new or changing lesions.  Staying in the shade or wearing long sleeves, sun glasses (UVA+UVB protection) and wide brim hats (4-inch brim around the entire circumference of the hat) are also recommended for sun protection.      Melanoma ABCDEs  Melanoma is the most dangerous type of skin cancer, and is the leading cause of death from skin disease.  You are more likely to develop melanoma if you: Have light-colored skin, light-colored eyes, or red or blond hair Spend a lot of time in the sun Tan regularly, either outdoors or in a tanning bed Have had blistering sunburns, especially during childhood Have a close family member who has had a melanoma Have atypical moles or large birthmarks  Early detection of melanoma is key since treatment is typically straightforward and cure rates are extremely high if we catch it early.   The first sign of melanoma is often a change in a mole or a new dark spot.  The ABCDE system is a way of remembering the signs of melanoma.  A for asymmetry:  The two halves do not match. B for border:  The edges of the growth are irregular. C for color:  A mixture of colors are present instead of an even brown color. D for diameter:  Melanomas are usually (but not always) greater than 6mm - the size of a pencil eraser. E for evolution:  The spot keeps changing in size, shape, and color.  Please check your skin once per month between visits. You can use a small mirror in front and a large mirror behind you to keep an eye on the back side or your body.   If you see any new or changing lesions before your next follow-up, please call to schedule a visit.  Please  continue daily skin protection including broad spectrum sunscreen SPF 30+ to sun-exposed areas, reapplying every 2 hours as needed when you're outdoors.   Staying in the shade or wearing long sleeves, sun glasses (UVA+UVB protection) and wide brim hats (4-inch brim around the entire circumference of the hat) are also recommended for sun protection.       Due to recent changes in healthcare laws, you may see results of your pathology and/or laboratory studies on MyChart before the doctors have had a chance to review them. We understand that in some cases there may be results that are confusing or concerning to you. Please understand that not all results are received at the same time and often the doctors may need to interpret multiple results in order to provide you with the best plan of care or course of treatment. Therefore, we ask that you please give us  2 business days to thoroughly review all your results before contacting the office for clarification. Should we see a critical lab result, you will be contacted sooner.   If You Need Anything After Your Visit  If you have any questions or concerns for your doctor, please call our main line at [phone] and press option 4 to reach your doctor's medical assistant. If no one answers, please leave a voicemail as directed and we will return your call as soon as possible. Messages left after  4 pm will be answered the following business day.   You may also send us  a message via MyChart. We typically respond to MyChart messages within 1-2 business days.  For prescription refills, please ask your pharmacy to contact our office. Our fax number is [phone].  If you have an urgent issue when the clinic is closed that cannot wait until the next business day, you can page your doctor at the number below.    Please note that while we do our best to be available for urgent issues outside of office hours, we are not available 24/7.   If you have an  urgent issue and are unable to reach us , you may choose to seek medical care at your doctor's office, retail clinic, urgent care center, or emergency room.  If you have a medical emergency, please immediately call 911 or go to the emergency department.  Pager Numbers  - Dr. Hester: [phone]  - Dr. Jackquline: [phone]  - Dr. Claudene: [phone]   - Dr. Raymund: [phone]  In the event of inclement weather, please call our main line at [phone] for an update on the status of any delays or closures.  Dermatology Medication Tips: Please keep the boxes that topical medications come in in order to help keep track of the instructions about where and how to use these. Pharmacies typically print the medication instructions only on the boxes and not directly on the medication tubes.   If your medication is too expensive, please contact our office at [phone] option 4 or send us  a message through MyChart.   We are unable to tell what your co-pay for medications will be in advance as this is different depending on your insurance coverage. However, we may be able to find a substitute medication at lower cost or fill out paperwork to get insurance to cover a needed medication.   If a prior authorization is required to get your medication covered by your insurance company, please allow us  1-2 business days to complete this process.  Drug prices often vary depending on where the prescription is filled and some pharmacies may offer cheaper prices.  The website www.goodrx.com contains coupons for medications through different pharmacies. The prices here do not account for what the cost may be with help from insurance (it may be cheaper with your insurance), but the website can give you the price if you did not use any insurance.  - You can print the associated coupon and take it with your prescription to the pharmacy.  - You may also stop by our office during regular business hours and  pick up a GoodRx coupon card.  - If you need your prescription sent electronically to a different pharmacy, notify our office through [facility] or by phone at [phone] option 4.     Si Usted Necesita Algo Despus de Su Visita  Tambin puede enviarnos un mensaje a travs de Clinical cytogeneticist. Por lo general respondemos a los mensajes de MyChart en el transcurso de 1 a 2 das hbiles.  Para renovar recetas, por favor pida a su farmacia que se ponga en contacto con nuestra oficina. Randi lakes de fax es [place] [phone].  Si tiene un asunto urgente cuando la clnica est cerrada y que no puede esperar hasta el siguiente da hbil, puede llamar/localizar a su doctor(a) al nmero que aparece a continuacin.   Por favor, tenga en cuenta que aunque hacemos todo lo posible para estar disponibles para asuntos urgentes fuera del horario  de oficina, no estamos disponibles las 24 horas del da, los 7 [address]  [address] de la [place].   Si tiene un problema urgente y no puede comunicarse con nosotros, puede optar por buscar atencin mdica  en el consultorio de su doctor(a), en una clnica privada, en un centro de atencin urgente o en una sala de emergencias.  Si tiene Engineer, drilling, por favor llame inmediatamente al 911 o vaya a la sala de emergencias.  Nmeros de bper  - Dr. Hester: [phone]  - Dra. Jackquline: [phone]  - Dr. Claudene: [phone]  - Dra. Kitts: [phone]  En caso de inclemencias del [place], por favor llame a nuestra lnea principal al [phone] para una actualizacin sobre el estado de cualquier retraso o cierre.  Consejos para la medicacin en dermatologa: Por favor, guarde las cajas en las que vienen los medicamentos de uso tpico para ayudarle a seguir las instrucciones sobre dnde y cmo usarlos. Las farmacias generalmente imprimen las instrucciones del medicamento slo en las cajas y no directamente en los tubos del [place].   Si su medicamento es muy  caro, por favor, pngase en contacto con landry rieger llamando al [phone] y presione la opcin 4 o envenos un mensaje a travs de Clinical cytogeneticist.   No podemos decirle cul ser su copago por los medicamentos por adelantado ya que esto es diferente dependiendo de la cobertura de su seguro. Sin embargo, es posible que podamos encontrar un medicamento sustituto a Audiological scientist un formulario para que el seguro cubra el medicamento que se considera necesario.   Si se requiere una autorizacin previa para que su compaa de seguros malta su medicamento, por favor permtanos de 1 a 2 das hbiles para completar este proceso.  Los precios de los medicamentos varan con frecuencia dependiendo del Environmental consultant de dnde se surte la receta y alguna farmacias pueden ofrecer precios ms baratos.  El sitio web www.goodrx.com tiene cupones para medicamentos de Health and safety inspector. Los precios aqu no tienen en cuenta lo que podra costar con la ayuda del seguro (puede ser ms barato con su seguro), pero el sitio web puede darle el precio si no utiliz Tourist information centre manager.  - Puede imprimir el cupn correspondiente y llevarlo con su receta a la farmacia.  - Tambin puede pasar por nuestra oficina durante el horario de atencin regular y Education officer, museum una tarjeta de cupones de GoodRx.  - Si necesita que su receta se enve electrnicamente a una farmacia diferente, informe a nuestra oficina a travs de MyChart de [place] o por telfono llamando al [phone] y presione la opcin 4.

## 2024-03-07 NOTE — Progress Notes (Signed)
 Follow-Up Visit   Subjective  Jeremy Higgins is a 89 y.o. male who presents for the following: Skin Cancer Screening and Upper Body Skin Exam. Hx of AKs. No personal Hx of skin cancer.   Areas of concern on face and chest.   The patient presents for Upper Body Skin Exam (UBSE) for skin cancer screening and mole check. The patient has spots, moles and lesions to be evaluated, some may be new or changing and the patient may have concern these could be cancer.    The following portions of the chart were reviewed this encounter and updated as appropriate: medications, allergies, medical history  Review of Systems:  No other skin or systemic complaints except as noted in HPI or Assessment and Plan.  Objective  Well appearing patient in no apparent distress; mood and affect are within normal limits.  All skin waist up examined. Relevant physical exam findings are noted in the Assessment and Plan.  Right Clavicle x1, R temple x1, R infraocular x1, R cheek x1 (4) Erythematous keratotic or waxy stuck-on papule  Assessment & Plan   INFLAMED SEBORRHEIC KERATOSIS (4) Right Clavicle x1, R temple x1, R infraocular x1, R cheek x1 (4) Symptomatic, irritating, patient would like treated. Destruction of lesion - Right Clavicle x1, R temple x1, R infraocular x1, R cheek x1 (4)  Destruction method: cryotherapy   Informed consent: discussed and consent obtained   Lesion destroyed using liquid nitrogen: Yes   Region frozen until ice ball extended beyond lesion: Yes   Outcome: patient tolerated procedure well with no complications   Post-procedure details: wound care instructions given   Additional details:  Prior to procedure, discussed risks of blister formation, small wound, skin dyspigmentation, or rare scar following cryotherapy. Recommend Vaseline ointment to treated areas while healing.   Skin cancer screening performed today.  Actinic Damage - Chronic condition, secondary to cumulative  UV/sun exposure - diffuse scaly erythematous macules with underlying dyspigmentation - Recommend daily broad spectrum sunscreen SPF 30+ to sun-exposed areas, reapply every 2 hours as needed.  - Staying in the shade or wearing long sleeves, sun glasses (UVA+UVB protection) and wide brim hats (4-inch brim around the entire circumference of the hat) are also recommended for sun protection.  - Call for new or changing lesions.  Lentigines, Seborrheic Keratoses, Hemangiomas - Benign normal skin lesions - Benign-appearing - Call for any changes  Melanocytic Nevi - Tan-brown and/or pink-flesh-colored symmetric macules and papules - Benign appearing on exam today - Observation - Call clinic for new or changing moles - Recommend daily use of broad spectrum spf 30+ sunscreen to sun-exposed areas.   EPIDERMAL INCLUSION CYST Exam: Subcutaneous nodule at R posterior shoulder  Benign-appearing. Exam most consistent with an epidermal inclusion cyst. Discussed that a cyst is a benign growth that can grow over time and sometimes get irritated or inflamed. Recommend observation if it is not bothersome. Discussed option of surgical excision to remove it if it is growing, symptomatic, or other changes noted. Please call for new or changing lesions so they can be evaluated.  TELANGIECTASIA Exam: violaceous macule consistent with dilated blood vessel. Confirmed on dermoscopy  Treatment Plan: Benign appearing on exam Call for changes   SEBORRHEIC KERATOSIS L cheek, L ear, L temple - Stuck-on, waxy, tan-brown papules and/or plaques  - Benign-appearing - Discussed benign etiology and prognosis. - Observe - Call for any changes  HISTORY OF PRECANCEROUS ACTINIC KERATOSIS - site(s) of PreCancerous Actinic Keratosis clear today at  left central upper and left central lower lip vermilion border. - these may recur and new lesions may form requiring treatment to prevent transformation into skin cancer -  observe for new or changing spots and contact [facility] for appointment if occur - photoprotection with sun protective clothing; sunglasses and broad spectrum sunscreen with SPF of at least 30 + and frequent self skin exams recommended - yearly exams by a dermatologist recommended for persons with history of PreCancerous Actinic Keratoses     ROSACEA Exam Mid face erythema with telangiectasias  Chronic condition with duration or expected duration over one year. Currently well-controlled.   Rosacea is a chronic progressive skin condition usually affecting the face of adults, causing redness and/or acne bumps. It is treatable but not curable. It sometimes affects the eyes (ocular rosacea) as well. It may respond to topical and/or systemic medication and can flare with stress, sun exposure, alcohol, exercise, topical steroids (including hydrocortisone/cortisone 10) and some foods.  Daily application of broad spectrum spf 30+ sunscreen to face is recommended to reduce flares.  Patient denies grittiness of the eyes  Treatment Plan Continue Metronidazole  0.75% gel to face at bedtime for rosacea.     Return in about 1 year (around 03/07/2025) for UBSE, HxAKs.  I, Jill Parcell, CMA, am acting as scribe for Rexene Rattler, MD.   Documentation: I have reviewed the above documentation for accuracy and completeness, and I agree with the above.  Rexene Rattler, MD

## 2024-05-18 ENCOUNTER — Ambulatory Visit (INDEPENDENT_AMBULATORY_CARE_PROVIDER_SITE_OTHER): Payer: Medicare Other | Admitting: Vascular Surgery

## 2024-05-18 ENCOUNTER — Other Ambulatory Visit (INDEPENDENT_AMBULATORY_CARE_PROVIDER_SITE_OTHER): Payer: Medicare Other

## 2024-05-18 ENCOUNTER — Encounter (INDEPENDENT_AMBULATORY_CARE_PROVIDER_SITE_OTHER): Payer: Self-pay | Admitting: Vascular Surgery

## 2024-05-18 VITALS — BP 128/64 | HR 75 | Resp 16 | Wt 174.4 lb

## 2024-05-18 DIAGNOSIS — I7143 Infrarenal abdominal aortic aneurysm, without rupture: Secondary | ICD-10-CM

## 2024-05-18 DIAGNOSIS — I6529 Occlusion and stenosis of unspecified carotid artery: Secondary | ICD-10-CM | POA: Diagnosis not present

## 2024-05-18 DIAGNOSIS — E782 Mixed hyperlipidemia: Secondary | ICD-10-CM

## 2024-05-18 DIAGNOSIS — I25118 Atherosclerotic heart disease of native coronary artery with other forms of angina pectoris: Secondary | ICD-10-CM | POA: Diagnosis not present

## 2024-05-18 DIAGNOSIS — I70213 Atherosclerosis of native arteries of extremities with intermittent claudication, bilateral legs: Secondary | ICD-10-CM | POA: Diagnosis not present

## 2024-05-18 NOTE — Progress Notes (Signed)
 "                                                                      MRN : [id number]  Jeremy Higgins is a 89 y.o. (09-May-1935) male who presents with chief complaint of check circulation.  History of Present Illness:   The patient returns to the office for discussion of a type II endoleak in association with an abdominal aortic aneurysm status post stent graft placement on 06/26/2015.    Patient denies abdominal pain or back pain, no other abdominal complaints. No groin related complaints. No symptoms consistent with distal embolization No changes in claudication distance.    There have been no interval changes in his overall healthcare since his last visit.    Patient denies amaurosis fugax or TIA symptoms. There is no history of claudication or rest pain symptoms of the lower extremities. The patient denies angina or shortness of breath.    Duplex US  of the aorta and iliac arteries shows a 3.66 AAA sac with no endoleak,  (previous AAA sac measured 3.60 cm).   ABI Rt=0.58 and Lt=0.65 (previous ABI Rt=0.66 and Lt=0.60)   Previous Carotid Duplex shows 1-39% RICA and <50% LICA; high grade external carotid  stenosis bilaterally.  No change compared to previous study dated December 31, 2016.  Current Meds  Medication Sig   bevacizumab (AVASTIN) 1.25 mg/0.1 mL SOLN 1.25 mg by Intravitreal route to Surgery. Both eyes   cetirizine (ZYRTEC) 10 MG tablet TAKE 1 TABLET(10 MG) BY MOUTH EVERY DAY   diltiazem  (CARDIZEM  CD) 180 MG 24 hr capsule take 1 capsule by mouth once daily   ELIQUIS  5 MG TABS tablet Take 5 mg by mouth 2 (two) times daily.    folic acid  (FOLVITE ) 1 MG tablet Take 1 mg by mouth daily.   latanoprost  (XALATAN ) 0.005 % ophthalmic solution Apply 1 drop to eye at bedtime.   lisinopril  (PRINIVIL ,ZESTRIL ) 10 MG tablet Take 5 mg by mouth daily. Reported on 06/26/2015   lovastatin (MEVACOR) 40 MG tablet Take 40 mg by mouth at bedtime.   metoprolol  tartrate (LOPRESSOR ) 25 MG tablet Take  25 mg by mouth as needed.    metroNIDAZOLE  (METROGEL ) 0.75 % gel Apply to nose, forehead, cheeks every night for rosacea.   mupirocin ointment (BACTROBAN) 2 % Apply topically.   sildenafil (VIAGRA) 100 MG tablet Take by mouth.   vitamin C  (ASCORBIC ACID ) 500 MG tablet Take 500 mg by mouth daily.    Past Medical History:  Diagnosis Date   Actinic keratosis    Aneurysm    Arthritis    right foot andankle    Atrial fibrillation (HCC) 07/14/2011   COPD (chronic obstructive pulmonary disease) (HCC)    Glaucoma    Hypertension     Past Surgical History:  Procedure Laterality Date   CATARACT EXTRACTION  2012   COLONOSCOPY  2010   ? [facility] MD   FOOT SURGERY Right 1950's   LEG SURGERY Right    PERIPHERAL VASCULAR CATHETERIZATION N/A 06/18/2015   Procedure: Abdominal Aortogram w/Lower Extremity;  Surgeon: Cordella KANDICE Shawl, MD;  Location: [facility] INVASIVE CV LAB;  Service: Cardiovascular;  Laterality: N/A;   PERIPHERAL VASCULAR CATHETERIZATION  06/18/2015   Procedure: Lower Extremity  Intervention;  Surgeon: Cordella KANDICE Shawl, MD;  Location: [facility] INVASIVE CV LAB;  Service: Cardiovascular;;   PERIPHERAL VASCULAR CATHETERIZATION N/A 06/26/2015   Procedure: Endovascular Repair/Stent Graft;  Surgeon: Cordella KANDICE Shawl, MD;  Location: [facility] INVASIVE CV LAB;  Service: Cardiovascular;  Laterality: N/A;   SPINAL FUSION  2011    Social History Social History   Tobacco Use   Smoking status: Former    Current packs/day: 0.00    Types: Cigarettes    Start date: 07/13/1958    Quit date: 07/13/1998    Years since quitting: 25.8   Smokeless tobacco: Never  Substance Use Topics   Alcohol use: Yes    Comment: very rare   Drug use: No    Family History Family History  Problem Relation Age of Onset   Heart attack Father     No Known Allergies   REVIEW OF SYSTEMS (Negative unless checked)  Constitutional: [] Weight loss  [] Fever  [] Chills Cardiac: [] Chest pain   [] Chest pressure   [] Palpitations    [] Shortness of breath when laying flat   [] Shortness of breath with exertion. Vascular:  [x] Pain in legs with walking   [] Pain in legs at rest  [] History of DVT   [] Phlebitis   [] Swelling in legs   [] Varicose veins   [] Non-healing ulcers Pulmonary:   [] Uses home oxygen   [] Productive cough   [] Hemoptysis   [] Wheeze  [] COPD   [] Asthma Neurologic:  [] Dizziness   [] Seizures   [] History of stroke   [] History of TIA  [] Aphasia   [] Vissual changes   [] Weakness or numbness in arm   [] Weakness or numbness in leg Musculoskeletal:   [] Joint swelling   [] Joint pain   [] Low back pain Hematologic:  [] Easy bruising  [] Easy bleeding   [] Hypercoagulable state   [] Anemic Gastrointestinal:  [] Diarrhea   [] Vomiting  [] Gastroesophageal reflux/heartburn   [] Difficulty swallowing. Genitourinary:  [] Chronic kidney disease   [] Difficult urination  [] Frequent urination   [] Blood in urine Skin:  [] Rashes   [] Ulcers  Psychological:  [] History of anxiety   []  History of major depression.  Physical Examination  Vitals:   05/18/24 0909  BP: 128/64  Pulse: 75  Resp: 16  Weight: 174 lb 6.4 oz (79.1 kg)   Body mass index is 24.32 kg/m. Gen: WD/WN, NAD Head: [place]/AT, No temporalis wasting.  Ear/Nose/Throat: Hearing grossly intact, nares w/o erythema or drainage Eyes: PER, EOMI, sclera nonicteric.  Neck: Supple, no masses.  No bruit or JVD.  Pulmonary:  Good air movement, no audible wheezing, no use of accessory muscles.  Cardiac: RRR, normal S1, S2, no Murmurs. Vascular:  mild trophic changes, no open wounds Vessel Right Left  Radial Palpable Palpable  PT Not Palpable Not Palpable  DP Not Palpable Not Palpable  Gastrointestinal: soft, non-distended. No guarding/no peritoneal signs.  Musculoskeletal: M/S 5/5 throughout.  No visible deformity.  Neurologic: CN 2-12 intact. Pain and light touch intact in extremities.  Symmetrical.  Speech is fluent. Motor exam as listed above. Psychiatric: Judgment intact, Mood &  affect appropriate for pt's clinical situation. Dermatologic: No rashes or ulcers noted.  No changes consistent with cellulitis.   CBC Lab Results  Component Value Date   WBC 8.7 05/22/2020   HGB 16.5 05/22/2020   HCT 50.5 05/22/2020   MCV 88.9 05/22/2020   PLT 214 05/22/2020    BMET    Component Value Date/Time   NA 139 05/22/2020 0951   K 4.6 05/22/2020 0951   CL 103 05/22/2020  0951   CO2 26 05/22/2020 0951   GLUCOSE 115 (H) 05/22/2020 0951   BUN 17 05/22/2020 0951   CREATININE 0.98 05/22/2020 0951   CALCIUM 9.1 05/22/2020 0951   GFRNONAA >60 05/22/2020 0951   GFRAA >60 07/01/2015 0456   CrCl cannot be calculated (Patient's most recent lab result is older than the maximum 21 days allowed.).  COAG Lab Results  Component Value Date   INR 1.23 06/30/2015   INR 1.46 06/29/2015   INR 1.08 06/18/2015    Radiology No results found.   Assessment/Plan 1. Infrarenal abdominal aortic aneurysm (AAA) without rupture (HCC) Recommend:  Patient is status post successful endovascular repair of the AAA.    No further intervention is required at this time.   No endoleak is detected and the aneurysm sac is stable.   The patient will continue antiplatelet therapy as prescribed as well as aggressive management of hyperlipidemia. Exercise is encouraged.    However, endografts require continued surveillance with ultrasound or CT scan. This is mandatory to detect any changes that allow repressurization of the aneurysm sac.  The patient is informed that this would be asymptomatic.   The patient is reminded that lifelong routine surveillance is a necessity with an endograft. Patient will continue to follow-up at the specified interval with ultrasound of the aorta. - VAS US  AORTA/IVC/ILIACS; Future   2. Stenosis of carotid artery, unspecified laterality Recommend:   Given the patient's asymptomatic subcritical stenosis no further invasive testing or surgery at this time.   Duplex  ultrasound shows <50% stenosis bilaterally.   Continue antiplatelet therapy as prescribed Continue management of CAD, HTN and Hyperlipidemia Healthy heart diet,  encouraged exercise at least 4 times per week Follow up in 24 months with duplex ultrasound and physical exam    3. 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  The patient does not voice lifestyle limiting changes at this point in time.   Noninvasive studies do not suggest clinically significant change.   No invasive studies, angiography or surgery at this time The patient should continue walking and begin a more formal exercise program.  The patient should continue antiplatelet therapy and aggressive treatment of the lipid abnormalities   No changes in the patient's medications at this time   The patient should continue wearing graduated compression socks 10-15 mmHg strength to control the mild edema.    4. Coronary artery disease of native artery of native heart with stable angina pectoris (HCC) Continue cardiac and antihypertensive medications as already ordered and reviewed, no changes at this time.   Continue statin as ordered and reviewed, no changes at this time   Nitrates PRN for chest pain   Cordella Shawl, MD  05/18/2024 9:48 AM   "

## 2024-08-10 ENCOUNTER — Encounter (INDEPENDENT_AMBULATORY_CARE_PROVIDER_SITE_OTHER): Payer: Self-pay | Admitting: Vascular Surgery

## 2024-08-10 DIAGNOSIS — E785 Hyperlipidemia, unspecified: Secondary | ICD-10-CM | POA: Insufficient documentation

## 2025-03-13 ENCOUNTER — Ambulatory Visit: Admitting: Dermatology

## 2025-05-17 ENCOUNTER — Ambulatory Visit (INDEPENDENT_AMBULATORY_CARE_PROVIDER_SITE_OTHER): Admitting: Vascular Surgery

## 2025-05-17 ENCOUNTER — Encounter (INDEPENDENT_AMBULATORY_CARE_PROVIDER_SITE_OTHER)
